# Patient Record
Sex: Male | Born: 1955 | Race: White | Hispanic: No | Marital: Married | Smoking: Former smoker
Health system: Southern US, Community
[De-identification: ages and names within clinical notes are randomized; demographics above are authoritative.]

## PROBLEM LIST (undated history)

## (undated) DIAGNOSIS — I2510 Atherosclerotic heart disease of native coronary artery without angina pectoris: Secondary | ICD-10-CM

## (undated) DIAGNOSIS — C801 Malignant (primary) neoplasm, unspecified: Secondary | ICD-10-CM

## (undated) DIAGNOSIS — J302 Other seasonal allergic rhinitis: Secondary | ICD-10-CM

## (undated) DIAGNOSIS — J339 Nasal polyp, unspecified: Secondary | ICD-10-CM

## (undated) DIAGNOSIS — G4733 Obstructive sleep apnea (adult) (pediatric): Secondary | ICD-10-CM

## (undated) DIAGNOSIS — J45909 Unspecified asthma, uncomplicated: Secondary | ICD-10-CM

## (undated) DIAGNOSIS — M109 Gout, unspecified: Secondary | ICD-10-CM

## (undated) DIAGNOSIS — K5090 Crohn's disease, unspecified, without complications: Secondary | ICD-10-CM

## (undated) DIAGNOSIS — E785 Hyperlipidemia, unspecified: Secondary | ICD-10-CM

## (undated) HISTORY — DX: Nasal polyp, unspecified: J33.9

## (undated) HISTORY — DX: Obstructive sleep apnea (adult) (pediatric): G47.33

## (undated) HISTORY — DX: Atherosclerotic heart disease of native coronary artery without angina pectoris: I25.10

## (undated) HISTORY — DX: Other seasonal allergic rhinitis: J30.2

## (undated) HISTORY — DX: Gout, unspecified: M10.9

## (undated) HISTORY — DX: Crohn's disease, unspecified, without complications: K50.90

## (undated) HISTORY — DX: Hyperlipidemia, unspecified: E78.5

---

## 1977-07-18 HISTORY — PX: COLON SURGERY: SHX602

## 1997-11-10 ENCOUNTER — Encounter: Admission: RE | Admit: 1997-11-10 | Discharge: 1997-11-10 | Payer: Self-pay | Admitting: Family Medicine

## 1997-12-09 ENCOUNTER — Encounter: Admission: RE | Admit: 1997-12-09 | Discharge: 1997-12-09 | Payer: Self-pay | Admitting: Sports Medicine

## 1997-12-18 ENCOUNTER — Encounter: Admission: RE | Admit: 1997-12-18 | Discharge: 1997-12-18 | Payer: Self-pay | Admitting: Family Medicine

## 1997-12-23 ENCOUNTER — Encounter: Admission: RE | Admit: 1997-12-23 | Discharge: 1997-12-23 | Payer: Self-pay | Admitting: Family Medicine

## 1999-08-17 ENCOUNTER — Ambulatory Visit (HOSPITAL_COMMUNITY): Admission: RE | Admit: 1999-08-17 | Discharge: 1999-08-17 | Payer: Self-pay | Admitting: *Deleted

## 2004-05-06 ENCOUNTER — Ambulatory Visit (HOSPITAL_COMMUNITY): Admission: RE | Admit: 2004-05-06 | Discharge: 2004-05-06 | Payer: Self-pay | Admitting: Surgery

## 2004-05-06 ENCOUNTER — Ambulatory Visit (HOSPITAL_BASED_OUTPATIENT_CLINIC_OR_DEPARTMENT_OTHER): Admission: RE | Admit: 2004-05-06 | Discharge: 2004-05-06 | Payer: Self-pay | Admitting: Surgery

## 2006-02-08 ENCOUNTER — Ambulatory Visit (HOSPITAL_COMMUNITY): Admission: RE | Admit: 2006-02-08 | Discharge: 2006-02-08 | Payer: Self-pay | Admitting: *Deleted

## 2011-05-27 ENCOUNTER — Institutional Professional Consult (permissible substitution): Payer: Self-pay | Admitting: Internal Medicine

## 2011-06-01 ENCOUNTER — Encounter: Payer: Self-pay | Admitting: Pulmonary Disease

## 2011-06-02 ENCOUNTER — Ambulatory Visit (INDEPENDENT_AMBULATORY_CARE_PROVIDER_SITE_OTHER): Payer: 59 | Admitting: Internal Medicine

## 2011-06-02 ENCOUNTER — Encounter: Payer: Self-pay | Admitting: Internal Medicine

## 2011-06-02 VITALS — BP 132/86 | HR 90 | Temp 97.7°F | Ht 67.5 in | Wt 168.0 lb

## 2011-06-02 DIAGNOSIS — R059 Cough, unspecified: Secondary | ICD-10-CM

## 2011-06-02 DIAGNOSIS — R05 Cough: Secondary | ICD-10-CM

## 2011-06-02 DIAGNOSIS — J45991 Cough variant asthma: Secondary | ICD-10-CM | POA: Insufficient documentation

## 2011-06-02 MED ORDER — MOMETASONE FURO-FORMOTEROL FUM 100-5 MCG/ACT IN AERO: 2.0000 | INHALATION_SPRAY | Freq: Two times a day (BID) | RESPIRATORY_TRACT | Status: DC

## 2011-06-02 NOTE — Patient Instructions (Addendum)
Work on Doctor, hospital technique:  relax and gently blow all the way out then take a nice smooth deep breath back in, triggering the inhaler at same time you start breathing in.  Hold for up to 5 seconds if you can then let it back out through your nose.  Rinse and gargle with water when done   If your mouth or throat starts to bother you,   I suggest you time the inhaler to your dental care and after using the inhaler(s) brush teeth and tongue with a baking soda containing toothpaste and when you rinse this out, gargle with it first to see if this helps your mouth and throat.    Stop advair and start dulera 100 Take 2 puffs first thing in am and then another 2 puffs about 12 hours later.    Be sure to take prilosec 20 mg x 30 min before bfast but also another dose before supper while still coughing.  GERD (REFLUX)  is an extremely common cause of respiratory symptoms, many times with no significant heartburn at all.    It can be treated with medication, but also with lifestyle changes including avoidance of late meals, excessive alcohol, smoking cessation, and avoid fatty foods, chocolate, peppermint, colas, red wine, and acidic juices such as orange juice.  NO MINT OR MENTHOL PRODUCTS SO NO COUGH DROPS  USE SUGARLESS CANDY INSTEAD (jolley ranchers or Stover's)  NO OIL BASED VITAMINS - use powdered substitutes.    As you improve you should be able to reduce your total number of medications dramatically, if not return to see me.

## 2011-06-02 NOTE — Progress Notes (Signed)
  Subjective:    Patient ID: ELIYOHU CLASS, male    DOB: 28-Apr-1956, 55 y.o.   MRN: [id number]  HPI  52 yowm quit smoking 1998 with seasonal allergies lifetime spring > fall seemed to handle it ok with otcs until the  fall of 2011 after uri while in [place]   developed more of a patern of  persistent sinus congestion   rx by Dr Wilson Singer and Janace Hoard best rx = prednisone then daily  cough started August 2012 and persistent since then eval by allergy 11/12 with concern for asthma rx with dulera since then but referred bo pulmonary 11/15 with issue of source of cough.   06/02/2011 1st pulmonary ov w/in 3 days of allergy eval cc cough no better yet, in fact worse p supper and at hs with slt green mucus.  Much better with albuterol, worse with advair.  Less nasal congestion at present. Sob mostly just during severe coughing fits.  Eventually get to  Sleeping ok without nocturnal  or early am exacerbation  of respiratory  c/o's or need for noct saba. Also denies any obvious fluctuation of symptoms with weather or environmental changes or other aggravating or alleviating factors except as outlined above    Review of Systems  Constitutional: Negative for fever, chills, activity change, appetite change and unexpected weight change.  HENT: Positive for congestion, rhinorrhea and sneezing. Negative for sore throat, trouble swallowing, dental problem, voice change and postnasal drip.   Eyes: Negative for visual disturbance.  Respiratory: Positive for cough and shortness of breath. Negative for choking.   Cardiovascular: Negative for chest pain and leg swelling.  Gastrointestinal: Negative for nausea, vomiting and abdominal pain.  Genitourinary: Negative for difficulty urinating.  Musculoskeletal: Negative for arthralgias.  Skin: Negative for rash.  Psychiatric/Behavioral: Negative for behavioral problems and confusion.       Objective:   Physical Exam  amb very anxious wm   Wt 168 06/02/2011    HEENT mild turbinate edema.  Oropharynx no thrush or excess pnd or cobblestoning.  No JVD or cervical adenopathy. Mild accessory muscle hypertrophy. Trachea midline, nl thryroid. Chest was hyperinflated by percussion with diminished breath sounds and moderate increased exp time without wheeze. Hoover sign positive at mid inspiration. Regular rate and rhythm without murmur gallop or rub or increase P2 or edema.  Abd: no hsm, nl excursion. Ext warm without cyanosis or clubbing.       cxr 04/26/11  wnl         Assessment & Plan:

## 2011-06-02 NOTE — Assessment & Plan Note (Signed)
The most common causes of chronic cough in immunocompetent adults include the following: upper airway cough syndrome (UACS), previously referred to as postnasal drip syndrome (PNDS), which is caused by variety of rhinosinus conditions; (2) asthma; (3) GERD; (4) chronic bronchitis from cigarette smoking or other inhaled environmental irritants; (5) nonasthmatic eosinophilic bronchitis; and (6) bronchiectasis.   These conditions, singly or in combination, have accounted for up to 94% of the causes of chronic cough in prospective studies.   Other conditions have constituted no >6% of the causes in prospective studies These have included bronchogenic carcinoma, chronic interstitial pneumonia, sarcoidosis, left ventricular failure, ACEI-induced cough, and aspiration from a condition associated with pharyngeal dysfunction.   Most likely this is a form of  Classic Upper airway cough syndrome, so named because it's frequently impossible to sort out how much is  CR/sinusitis with freq throat clearing (which can be related to primary GERD)   vs  causing  secondary (" extra esophageal")  GERD from wide swings in gastric pressure that occur with throat clearing, often  promoting self use of mint and menthol lozenges that reduce the lower esophageal sphincter tone and exacerbate the problem further in a cyclical fashion.   These are the same pts who not infrequently have failed to tolerate ace inhibitors,  dry powder inhalers or biphosphonates or report having reflux symptoms that don't respond to standard doses of PPI , and are easily confused as having aecopd or asthma flares,   For now treat the cycle of cough induces gerd induces cough with max gerd / cough suppression and changed advair to dulera 100 as this causes less cough in my experience  See instructions for specific recommendations which were reviewed directly with the patient who was given a copy with highlighter outlining the key components.

## 2012-06-06 ENCOUNTER — Other Ambulatory Visit: Payer: Self-pay | Admitting: Otolaryngology

## 2014-03-12 ENCOUNTER — Other Ambulatory Visit: Payer: Self-pay | Admitting: Urology

## 2014-03-12 DIAGNOSIS — C61 Malignant neoplasm of prostate: Secondary | ICD-10-CM

## 2014-03-17 ENCOUNTER — Other Ambulatory Visit (HOSPITAL_COMMUNITY): Payer: Self-pay | Admitting: Urology

## 2014-03-17 ENCOUNTER — Ambulatory Visit (HOSPITAL_COMMUNITY)
Admission: RE | Admit: 2014-03-17 | Discharge: 2014-03-17 | Disposition: A | Discharge status: Home / Self Care | Payer: 59 | Source: Ambulatory Visit | Attending: Urology | Admitting: Urology

## 2014-03-17 DIAGNOSIS — C61 Malignant neoplasm of prostate: Secondary | ICD-10-CM | POA: Insufficient documentation

## 2014-04-07 ENCOUNTER — Ambulatory Visit (HOSPITAL_COMMUNITY)
Admission: RE | Admit: 2014-04-07 | Discharge: 2014-04-07 | Disposition: A | Discharge status: Home / Self Care | Payer: 59 | Source: Ambulatory Visit | Attending: Urology | Admitting: Urology

## 2014-04-07 DIAGNOSIS — C61 Malignant neoplasm of prostate: Secondary | ICD-10-CM | POA: Diagnosis not present

## 2014-04-07 LAB — POCT I-STAT CREATININE: Creatinine, Ser: 0.9 mg/dL (ref 0.50–1.35)

## 2014-04-07 MED ORDER — GADOBENATE DIMEGLUMINE 529 MG/ML IV SOLN
20.0000 mL | Freq: Once | INTRAVENOUS | Status: AC | PRN
  Administered 2014-04-07: 16 mL via INTRAVENOUS

## 2016-04-11 DIAGNOSIS — R972 Elevated prostate specific antigen [PSA]: Secondary | ICD-10-CM | POA: Diagnosis not present

## 2016-04-11 DIAGNOSIS — C61 Malignant neoplasm of prostate: Secondary | ICD-10-CM | POA: Diagnosis not present

## 2016-04-15 DIAGNOSIS — J3081 Allergic rhinitis due to animal (cat) (dog) hair and dander: Secondary | ICD-10-CM | POA: Diagnosis not present

## 2016-04-15 DIAGNOSIS — J4540 Moderate persistent asthma, uncomplicated: Secondary | ICD-10-CM | POA: Diagnosis not present

## 2016-04-15 DIAGNOSIS — J3089 Other allergic rhinitis: Secondary | ICD-10-CM | POA: Diagnosis not present

## 2016-04-15 DIAGNOSIS — J301 Allergic rhinitis due to pollen: Secondary | ICD-10-CM | POA: Diagnosis not present

## 2016-06-08 DIAGNOSIS — D225 Melanocytic nevi of trunk: Secondary | ICD-10-CM | POA: Diagnosis not present

## 2016-06-08 DIAGNOSIS — C44319 Basal cell carcinoma of skin of other parts of face: Secondary | ICD-10-CM | POA: Diagnosis not present

## 2016-06-08 DIAGNOSIS — D2261 Melanocytic nevi of right upper limb, including shoulder: Secondary | ICD-10-CM | POA: Diagnosis not present

## 2016-06-08 DIAGNOSIS — D2262 Melanocytic nevi of left upper limb, including shoulder: Secondary | ICD-10-CM | POA: Diagnosis not present

## 2016-06-08 DIAGNOSIS — L821 Other seborrheic keratosis: Secondary | ICD-10-CM | POA: Diagnosis not present

## 2016-06-16 DIAGNOSIS — Z23 Encounter for immunization: Secondary | ICD-10-CM | POA: Diagnosis not present

## 2016-09-20 DIAGNOSIS — D1801 Hemangioma of skin and subcutaneous tissue: Secondary | ICD-10-CM | POA: Diagnosis not present

## 2016-09-21 DIAGNOSIS — C61 Malignant neoplasm of prostate: Secondary | ICD-10-CM | POA: Diagnosis not present

## 2016-09-26 DIAGNOSIS — C61 Malignant neoplasm of prostate: Secondary | ICD-10-CM | POA: Diagnosis not present

## 2016-10-12 DIAGNOSIS — E789 Disorder of lipoprotein metabolism, unspecified: Secondary | ICD-10-CM | POA: Diagnosis not present

## 2016-10-12 DIAGNOSIS — Z125 Encounter for screening for malignant neoplasm of prostate: Secondary | ICD-10-CM | POA: Diagnosis not present

## 2016-10-20 DIAGNOSIS — Z0001 Encounter for general adult medical examination with abnormal findings: Secondary | ICD-10-CM | POA: Diagnosis not present

## 2016-10-21 DIAGNOSIS — H25013 Cortical age-related cataract, bilateral: Secondary | ICD-10-CM | POA: Diagnosis not present

## 2016-10-21 DIAGNOSIS — H35361 Drusen (degenerative) of macula, right eye: Secondary | ICD-10-CM | POA: Diagnosis not present

## 2016-10-21 DIAGNOSIS — H2513 Age-related nuclear cataract, bilateral: Secondary | ICD-10-CM | POA: Diagnosis not present

## 2016-10-21 DIAGNOSIS — H524 Presbyopia: Secondary | ICD-10-CM | POA: Diagnosis not present

## 2016-10-21 DIAGNOSIS — H43393 Other vitreous opacities, bilateral: Secondary | ICD-10-CM | POA: Diagnosis not present

## 2016-11-11 DIAGNOSIS — J310 Chronic rhinitis: Secondary | ICD-10-CM | POA: Diagnosis not present

## 2016-11-11 DIAGNOSIS — Z6826 Body mass index (BMI) 26.0-26.9, adult: Secondary | ICD-10-CM | POA: Diagnosis not present

## 2016-11-11 DIAGNOSIS — J329 Chronic sinusitis, unspecified: Secondary | ICD-10-CM | POA: Diagnosis not present

## 2016-11-23 DIAGNOSIS — J320 Chronic maxillary sinusitis: Secondary | ICD-10-CM | POA: Diagnosis not present

## 2016-11-23 DIAGNOSIS — J329 Chronic sinusitis, unspecified: Secondary | ICD-10-CM | POA: Diagnosis not present

## 2016-11-23 DIAGNOSIS — R937 Abnormal findings on diagnostic imaging of other parts of musculoskeletal system: Secondary | ICD-10-CM | POA: Diagnosis not present

## 2016-11-23 DIAGNOSIS — J324 Chronic pansinusitis: Secondary | ICD-10-CM | POA: Diagnosis not present

## 2016-11-23 DIAGNOSIS — R918 Other nonspecific abnormal finding of lung field: Secondary | ICD-10-CM | POA: Diagnosis not present

## 2016-11-23 DIAGNOSIS — Z01818 Encounter for other preprocedural examination: Secondary | ICD-10-CM | POA: Diagnosis not present

## 2016-11-23 DIAGNOSIS — J342 Deviated nasal septum: Secondary | ICD-10-CM | POA: Diagnosis not present

## 2016-11-23 DIAGNOSIS — Z6826 Body mass index (BMI) 26.0-26.9, adult: Secondary | ICD-10-CM | POA: Diagnosis not present

## 2016-11-24 DIAGNOSIS — J324 Chronic pansinusitis: Secondary | ICD-10-CM | POA: Diagnosis not present

## 2016-11-28 DIAGNOSIS — Z87891 Personal history of nicotine dependence: Secondary | ICD-10-CM | POA: Diagnosis not present

## 2016-11-28 DIAGNOSIS — G4733 Obstructive sleep apnea (adult) (pediatric): Secondary | ICD-10-CM | POA: Diagnosis not present

## 2016-11-28 DIAGNOSIS — J309 Allergic rhinitis, unspecified: Secondary | ICD-10-CM | POA: Diagnosis not present

## 2016-11-28 DIAGNOSIS — K219 Gastro-esophageal reflux disease without esophagitis: Secondary | ICD-10-CM | POA: Diagnosis not present

## 2016-11-28 DIAGNOSIS — K5090 Crohn's disease, unspecified, without complications: Secondary | ICD-10-CM | POA: Diagnosis not present

## 2016-11-28 DIAGNOSIS — J45909 Unspecified asthma, uncomplicated: Secondary | ICD-10-CM | POA: Diagnosis not present

## 2016-11-28 DIAGNOSIS — Z79899 Other long term (current) drug therapy: Secondary | ICD-10-CM | POA: Diagnosis not present

## 2016-11-28 DIAGNOSIS — J310 Chronic rhinitis: Secondary | ICD-10-CM | POA: Diagnosis not present

## 2016-11-28 DIAGNOSIS — J329 Chronic sinusitis, unspecified: Secondary | ICD-10-CM | POA: Diagnosis not present

## 2016-11-28 DIAGNOSIS — J339 Nasal polyp, unspecified: Secondary | ICD-10-CM | POA: Diagnosis not present

## 2016-12-07 DIAGNOSIS — J324 Chronic pansinusitis: Secondary | ICD-10-CM | POA: Diagnosis not present

## 2016-12-28 DIAGNOSIS — J324 Chronic pansinusitis: Secondary | ICD-10-CM | POA: Diagnosis not present

## 2017-02-03 ENCOUNTER — Ambulatory Visit (INDEPENDENT_AMBULATORY_CARE_PROVIDER_SITE_OTHER)
Admission: RE | Admit: 2017-02-03 | Discharge: 2017-02-03 | Disposition: A | Discharge status: Home / Self Care | Payer: BLUE CROSS/BLUE SHIELD | Source: Ambulatory Visit | Attending: Internal Medicine | Admitting: Internal Medicine

## 2017-02-03 ENCOUNTER — Ambulatory Visit (INDEPENDENT_AMBULATORY_CARE_PROVIDER_SITE_OTHER): Payer: BLUE CROSS/BLUE SHIELD | Admitting: Internal Medicine

## 2017-02-03 ENCOUNTER — Encounter: Payer: Self-pay | Admitting: Internal Medicine

## 2017-02-03 VITALS — BP 140/84 | HR 90 | Ht 67.0 in | Wt 176.8 lb

## 2017-02-03 DIAGNOSIS — R911 Solitary pulmonary nodule: Secondary | ICD-10-CM | POA: Diagnosis not present

## 2017-02-03 DIAGNOSIS — R0600 Dyspnea, unspecified: Secondary | ICD-10-CM

## 2017-02-03 DIAGNOSIS — J45991 Cough variant asthma: Secondary | ICD-10-CM | POA: Diagnosis not present

## 2017-02-03 DIAGNOSIS — R059 Cough, unspecified: Secondary | ICD-10-CM

## 2017-02-03 DIAGNOSIS — R0609 Other forms of dyspnea: Secondary | ICD-10-CM

## 2017-02-03 DIAGNOSIS — R05 Cough: Secondary | ICD-10-CM | POA: Diagnosis not present

## 2017-02-03 MED ORDER — MOMETASONE FURO-FORMOTEROL FUM 100-5 MCG/ACT IN AERO: 2.0000 | INHALATION_SPRAY | Freq: Every day | RESPIRATORY_TRACT | 0 refills | Status: DC | PRN

## 2017-02-03 NOTE — Progress Notes (Signed)
Subjective:    Patient ID: Dale Thompson, male    DOB: 1955-11-22,     MRN: [id number]    Brief patient profile:  52 yowm quit smoking 1998 with seasonal allergies lifetime spring > fall seemed to handle it ok with otcs until the  fall of 2011 after uri while in [place]   developed more of a patern of  persistent sinus congestion   rx by Dr Wilson Singer and Janace Hoard best rx = prednisone then daily  cough started August 2012 and persistent since then eval by allergy vanWinkle 11/12 with concern for asthma rx with dulera since then but referred bo pulmonary 11/15 with issue of source of cough and referred back 02/03/2017 for ?  SPN LLL on cxr     History of Present Illness  06/02/2011 1st pulmonary ov w/in 3 days of allergy eval cc cough no better yet, in fact worse p supper and at hs with slt green mucus.  Much better with albuterol, worse with advair.  Less nasal congestion at present. Sob mostly just during severe coughing fits. rec Work on Gaffer:  Stop advair and start dulera 100 Take 2 puffs first thing in am and then another 2 puffs about 12 hours later.  be sure to take prilosec 20 mg x 30 min before bfast but also another dose before supper while still coughing. GERD (REFLUX)  As you improve you should be able to reduce your total number of medications dramatically, if not return to see me.    02/03/2017  Consultation/ ov/Wert re: spn/ doe  Chief Complaint  Patient presents with  . Pulmonary Consult    Referred by Dr. Deland Pretty for eval of incidental pulmonary nodule.  Pt states that he has always had some SOB ever since his teens, but it has been progressively worse. He gets winded walking up an incline.   doing much better since sinus surgery per [facility] but preop cxr suggested LLL spn so pt referred back here  Only sob with exercise and gradually worse since teenager x when he was working out more regularly 6 m prior to [place] 100 but rarely uses it, not  really sure it helps much (poor hfa, see a/p)    No obvious day to day or daytime variability or assoc excess/ purulent sputum or mucus plugs or hemoptysis or cp or chest tightness, subjective wheeze or overt sinus or hb symptoms. No unusual exp hx or h/o childhood pna/ asthma or knowledge of premature birth.  Sleeping ok without nocturnal  or early am exacerbation  of respiratory  c/o's or need for noct saba. Also denies any obvious fluctuation of symptoms with weather or environmental changes or other aggravating or alleviating factors except as outlined above   Current Medications, Allergies, Complete Past Medical History, Past Surgical History, Family History, and Social History were reviewed in [employer] record.  ROS  The following are not active complaints unless bolded sore throat, dysphagia, dental problems, itching, sneezing,  nasal congestion controlled with flonase or excess/ purulent secretions, ear ache,   fever, chills, sweats, unintended wt loss, classically pleuritic or exertional cp,  orthopnea pnd or leg swelling, presyncope, palpitations, abdominal pain, anorexia, nausea, vomiting, diarrhea  or change in bowel or bladder habits, change in stools or urine, dysuria,hematuria,  rash, arthralgias, visual complaints, headache, numbness, weakness or ataxia or problems with walking or coordination,  change in mood/affect or memory.  Objective:   Physical Exam  amb very anxious slt hoarse wm nad   Wt Readings from Last 3 Encounters:  02/03/17 176 lb 12.8 oz (80.2 kg)  06/02/11 168 lb (76.2 kg)   Vital signs reviewed - Note on arrival 02 sats  93% on RA     HEENT: nl dentition, turbinates bilaterally, and oropharynx. Nl external ear canals without cough reflex   NECK :  without JVD/Nodes/TM/ nl carotid upstrokes bilaterally   LUNGS: no acc muscle use,  Nl contour chest which is clear to A and P bilaterally without cough on insp or  exp maneuvers   CV:  RRR  no s3 or murmur or increase in P2, and no edema   ABD:  soft and nontender with nl inspiratory excursion in the supine position. No bruits or organomegaly appreciated, bowel sounds nl  MS:  Nl gait/ ext warm without deformities, calf tenderness, cyanosis or clubbing No obvious joint restrictions   SKIN: warm and dry without lesions    NEURO:  alert, approp, nl sensorium with  no motor or cerebellar deficits apparent.      I  reviewed report and agree with radiology impression as follows:  CXR:   11/23/16 No acute airspace disease. 5 mm nodular opacity over the left lung base may represent a pulmonary nodule. Chest CT recommended for further evaluation.   CXR PA and Lateral:   02/03/2017 :    I personally reviewed images and agree with radiology impression as follows:   The heart size and mediastinal contours are within normal limits. Both lungs are clear. The visualized skeletal structures are unremarkable. There mild linear scarring at the LEFT base, similar to priors.  With regard to the stated history, I do not have prior chest radiograph or cross-sectional imaging which documents a LEFT lower lobe nodule. None is seen on today's exam. My impression:  Hard to exclude a nodule in L base but mostly just linear scarring             Assessment & Plan:

## 2017-02-03 NOTE — Patient Instructions (Addendum)
If any trouble with breathing or wheezing or cough > dulera 100 up to 2 puffs every 12 hours    Please remember to go to the  x-ray department downstairs in the basement  for your tests - we will call you with the results when they are available. Add  HRCT in 3 m

## 2017-02-04 ENCOUNTER — Encounter: Payer: Self-pay | Admitting: Internal Medicine

## 2017-02-04 DIAGNOSIS — R0600 Dyspnea, unspecified: Secondary | ICD-10-CM | POA: Insufficient documentation

## 2017-02-04 DIAGNOSIS — R0609 Other forms of dyspnea: Secondary | ICD-10-CM

## 2017-02-04 NOTE — Assessment & Plan Note (Addendum)
Note this was better with regular ex so probably just related to deconditioning/ reviewed   Since there is a def change over baseline with "abn cxr" > hrct will be done in 3 m to complete the w/u  Total time devoted to counseling  > 50 % of initial 60 min office visit:  review case with pt/ discussion of options/alternatives/ personally creating written customized instructions  in presence of pt  then going over those specific  Instructions directly with the pt including how to use all of the meds but in particular covering each new medication in detail and the difference between the maintenance= "automatic" meds and the prns using an action plan format for the latter (If this problem/symptom => do that organization reading Left to right).  Please see AVS from this visit for a full list of these instructions which I personally wrote for this pt and  are unique to this visit.

## 2017-02-04 NOTE — Assessment & Plan Note (Addendum)
Spirometry 02/03/2017 wnl including mid flows/ f/v curve off rx s active symptoms other than doe      - hfa 75% p coaching 02/03/2017 > increase dulera  100 to 2bid when cough or sob  He appears to have very mild persistent asthma. Based on the study from NEJM  378; 18 p 1865 (2018) in pts with mild asthma it is reasonable to use low dose dulera  1000 2bid "prn" flare in this setting but I emphasized this was only shown with symbicort (which is very similar to dulera) and takes advantage of the rapid onset of action but is not the same as "rescue therapy" but can be stopped once the acute symptoms have resolved and the need for rescue has been minimized (< 2 x weekly)    Total time devoted to counseling  > 50 % of initial 60 min office visit:  review case with pt/ discussion of options/alternatives/ personally creating written customized instructions  in presence of pt  then going over those specific  Instructions directly with the pt including how to use all of the meds but in particular covering each new medication in detail and the difference between the maintenance= "automatic" meds and the prns using an action plan format for the latter (If this problem/symptom => do that organization reading Left to right).  Please see AVS from this visit for a full list of these instructions which I personally wrote for this pt and  are unique to this visit.

## 2017-02-04 NOTE — Assessment & Plan Note (Signed)
Reported on cxr 11/23/16 but no def seen 02/03/2017   He does have some linear scarring/ increased markings in L base with unexplained doe so rec HRCT in 3 months to close the loop on both problems as he is a former smoker with low but not "no" risk   Discussed in detail all the  indications, usual  risks and alternatives  relative to the benefits with patient who agrees to proceed with conservative f/u as outlined

## 2017-02-06 ENCOUNTER — Other Ambulatory Visit: Payer: Self-pay | Admitting: Internal Medicine

## 2017-02-06 DIAGNOSIS — R911 Solitary pulmonary nodule: Secondary | ICD-10-CM

## 2017-02-06 NOTE — Progress Notes (Signed)
Spoke with pt and notified of results per Dr. Wert. Pt verbalized understanding and denied any questions. 

## 2017-03-01 DIAGNOSIS — J324 Chronic pansinusitis: Secondary | ICD-10-CM | POA: Diagnosis not present

## 2017-03-27 DIAGNOSIS — C61 Malignant neoplasm of prostate: Secondary | ICD-10-CM | POA: Diagnosis not present

## 2017-03-30 ENCOUNTER — Telehealth: Payer: Self-pay | Admitting: Internal Medicine

## 2017-03-30 NOTE — Telephone Encounter (Signed)
Ok PCC"s can we do this for pt. We may have to fax order to the number below. Thanks.

## 2017-03-30 NOTE — Telephone Encounter (Signed)
Order faxed to [facility] on [address]

## 2017-04-03 DIAGNOSIS — C61 Malignant neoplasm of prostate: Secondary | ICD-10-CM | POA: Diagnosis not present

## 2017-04-03 DIAGNOSIS — E291 Testicular hypofunction: Secondary | ICD-10-CM | POA: Diagnosis not present

## 2017-04-17 DIAGNOSIS — J301 Allergic rhinitis due to pollen: Secondary | ICD-10-CM | POA: Diagnosis not present

## 2017-04-17 DIAGNOSIS — J4540 Moderate persistent asthma, uncomplicated: Secondary | ICD-10-CM | POA: Diagnosis not present

## 2017-04-17 DIAGNOSIS — J3081 Allergic rhinitis due to animal (cat) (dog) hair and dander: Secondary | ICD-10-CM | POA: Diagnosis not present

## 2017-04-17 DIAGNOSIS — J3089 Other allergic rhinitis: Secondary | ICD-10-CM | POA: Diagnosis not present

## 2017-04-18 ENCOUNTER — Other Ambulatory Visit: Payer: BLUE CROSS/BLUE SHIELD

## 2017-04-18 DIAGNOSIS — R918 Other nonspecific abnormal finding of lung field: Secondary | ICD-10-CM | POA: Diagnosis not present

## 2017-04-21 ENCOUNTER — Telehealth: Payer: Self-pay | Admitting: Internal Medicine

## 2017-04-21 NOTE — Telephone Encounter (Signed)
CT Chest done at [facility] on 04/18/17 was reviewed by MW Per MW- study shows no change, needs f/u CT Chest without CM in 3 months and then ov next day  [facility]

## 2017-04-24 DIAGNOSIS — E291 Testicular hypofunction: Secondary | ICD-10-CM | POA: Diagnosis not present

## 2017-04-24 DIAGNOSIS — C61 Malignant neoplasm of prostate: Secondary | ICD-10-CM | POA: Diagnosis not present

## 2017-04-24 NOTE — Telephone Encounter (Signed)
Advised pt of results. Pt understood and nothing further is needed.   CT ordered. Please call to scheule/

## 2017-05-02 DIAGNOSIS — Z23 Encounter for immunization: Secondary | ICD-10-CM | POA: Diagnosis not present

## 2017-06-05 ENCOUNTER — Telehealth: Payer: Self-pay | Admitting: Internal Medicine

## 2017-06-05 DIAGNOSIS — R911 Solitary pulmonary nodule: Secondary | ICD-10-CM

## 2017-06-05 NOTE — Telephone Encounter (Signed)
Fine with me

## 2017-06-05 NOTE — Telephone Encounter (Signed)
Dr. Melvyn Novas, ok to schedule CT at the end of December instead of January? He would like to do this for better coverage with insurance. Please advise.

## 2017-06-05 NOTE — Telephone Encounter (Signed)
Spoke with pt,aware of recs.  Nothing further needed.  

## 2017-06-14 DIAGNOSIS — Z6828 Body mass index (BMI) 28.0-28.9, adult: Secondary | ICD-10-CM | POA: Diagnosis not present

## 2017-06-14 DIAGNOSIS — J329 Chronic sinusitis, unspecified: Secondary | ICD-10-CM | POA: Diagnosis not present

## 2017-06-14 DIAGNOSIS — J310 Chronic rhinitis: Secondary | ICD-10-CM | POA: Diagnosis not present

## 2017-06-22 ENCOUNTER — Other Ambulatory Visit: Payer: Self-pay | Admitting: Internal Medicine

## 2017-06-22 DIAGNOSIS — R911 Solitary pulmonary nodule: Secondary | ICD-10-CM

## 2017-07-14 DIAGNOSIS — C61 Malignant neoplasm of prostate: Secondary | ICD-10-CM | POA: Diagnosis not present

## 2017-07-14 DIAGNOSIS — R918 Other nonspecific abnormal finding of lung field: Secondary | ICD-10-CM | POA: Diagnosis not present

## 2017-07-14 DIAGNOSIS — J984 Other disorders of lung: Secondary | ICD-10-CM | POA: Diagnosis not present

## 2017-07-14 DIAGNOSIS — E291 Testicular hypofunction: Secondary | ICD-10-CM | POA: Diagnosis not present

## 2017-07-20 ENCOUNTER — Telehealth: Payer: Self-pay | Admitting: Internal Medicine

## 2017-07-20 NOTE — Telephone Encounter (Signed)
Per MW- CT chest from [facility] dated 07/14/17 showed no changes, but he does want to see him in the office in the next 2 wks. Spoke with pt and notified of results per Dr. Melvyn Novas. Pt verbalized understanding and denied any questions.

## 2017-07-24 ENCOUNTER — Encounter: Payer: Self-pay | Admitting: Internal Medicine

## 2017-07-24 ENCOUNTER — Other Ambulatory Visit: Payer: BLUE CROSS/BLUE SHIELD

## 2017-07-24 ENCOUNTER — Ambulatory Visit (INDEPENDENT_AMBULATORY_CARE_PROVIDER_SITE_OTHER): Payer: BLUE CROSS/BLUE SHIELD | Admitting: Internal Medicine

## 2017-07-24 VITALS — BP 138/80 | HR 86 | Ht 67.0 in | Wt 180.0 lb

## 2017-07-24 DIAGNOSIS — R911 Solitary pulmonary nodule: Secondary | ICD-10-CM

## 2017-07-24 DIAGNOSIS — J45991 Cough variant asthma: Secondary | ICD-10-CM

## 2017-07-24 NOTE — Assessment & Plan Note (Signed)
Reported on cxr 11/23/16 but not def seen 02/03/2017  - HRCT 04/18/17 :  Bilateral gg nodules 1.6 cm rul largest   - CT w/o contrast 07/14/17  No change LUL / no mention of RUL by radiology report from [facility]  - placed in reminder file for CT chest 1st week in 2020    CT results reviewed with pt >>> Too small for PET or bx, not suspicious enough for excisional bx > really only option for now is follow the Fleischner society guidelines as rec by radiology = 1 year f/u CT unless new symptoms  Discussed in detail all the  indications, usual  risks and alternatives  relative to the benefits with patient who agrees to proceed with conservative f/u as outlined

## 2017-07-24 NOTE — Assessment & Plan Note (Signed)
Spirometry 02/03/2017 wnl including mid flows/ f/v curve off rx s active symptoms other than doe     - hfa 75% p coaching 02/03/2017 > increase dulera  100 to 2bid when cough or sob  All goals of chronic asthma control met including optimal function and elimination of symptoms with minimal need for rescue therapy.  Contingencies discussed in full including contacting this office immediately if not controlling the symptoms using the rule of two's.      I had an extended discussion with the patient reviewing all relevant studies completed to date and  lasting 15 to 20 minutes of a 25 minute visit    Each maintenance medication was reviewed in detail including most importantly the difference between maintenance and prns and under what circumstances the prns are to be triggered using an action plan format that is not reflected in the computer generated alphabetically organized AVS.    Please see AVS for specific instructions unique to this visit that I personally wrote and verbalized to the the pt in detail and then reviewed with pt  by my nurse highlighting any  changes in therapy recommended at today's visit to their plan of care.

## 2017-07-24 NOTE — Patient Instructions (Addendum)
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

## 2017-07-24 NOTE — Progress Notes (Signed)
Subjective:    Patient ID: Dale Thompson, male    DOB: 1955/09/04,     MRN: [id number]    Brief patient profile:  32 yowm quit smoking 1998 with seasonal allergies lifetime spring > fall seemed to handle it ok with otcs until the  fall of 2011 after uri while in [place]   developed more of a patern of  persistent sinus congestion   rx by Dr Wilson Singer and Janace Hoard best rx = prednisone then daily  cough started August 2012 and persistent since then eval by allergy vanWinkle 11/12 with concern for asthma rx with dulera since then but referred bo pulmonary 11/15 with issue of source of cough and referred back 02/03/2017 for ?  SPN LLL on cxr     History of Present Illness  06/02/2011 1st pulmonary ov w/in 3 days of allergy eval cc cough no better yet, in fact worse p supper and at hs with slt green mucus.  Much better with albuterol, worse with advair.  Less nasal congestion at present. Sob mostly just during severe coughing fits. rec Work on Gaffer:  Stop advair and start dulera 100 Take 2 puffs first thing in am and then another 2 puffs about 12 hours later.  be sure to take prilosec 20 mg x 30 min before bfast but also another dose before supper while still coughing. GERD (REFLUX)  As you improve you should be able to reduce your total number of medications dramatically, if not return to see me.    02/03/2017  Consultation/ ov/Dale Thompson re: spn/ doe  Chief Complaint  Patient presents with  . Pulmonary Consult    Referred by Dr. Deland Pretty for eval of incidental pulmonary nodule.  Pt states that he has always had some SOB ever since his teens, but it has been progressively worse. He gets winded walking up an incline.   doing much better since sinus surgery per [facility] but preop cxr suggested LLL spn so pt referred back here Only sob with exercise and gradually worse since teenager x when he was working out more regularly 6 m prior to [place] 100 but rarely uses it, not  really sure it helps much (poor hfa, see a/p)  rec If any trouble with breathing or wheezing or cough > dulera 100 up to 2 puffs every 12 hours  Please remember to go to the  x-ray department downstairs in the basement  for your tests - we will call you with the results when they are available. Add  HRCT in 3 m    07/24/2017  f/u ov/Dale Thompson re:  Chief Complaint  Patient presents with  . Follow-up    CT Chest done [facility] 07/14/17. He states doing well and no new co's today. He uses the [facility] 2 x per wk on average.  cough much better as is breathing just using the dulera 100 a few times a week  Sleeping fine/ Not limited by breathing from desired activities    No obvious day to day or daytime variability or assoc excess/ purulent sputum or mucus plugs or hemoptysis or cp or chest tightness, subjective wheeze or overt   hb symptoms. No unusual exposure hx or h/o childhood pna/ asthma or knowledge of premature birth.  Sleeping ok flat without nocturnal  or early am exacerbation  of respiratory  c/o's or need for noct saba. Also denies any obvious fluctuation of symptoms with weather or environmental changes or other aggravating or  alleviating factors except as outlined above   Current Allergies, Complete Past Medical History, Past Surgical History, Family History, and Social History were reviewed in [employer] record.  ROS  The following are not active complaints unless bolded Hoarseness, sore throat, dysphagia, dental problems, itching, sneezing,  nasal congestion or discharge of excess mucus or purulent secretions, ear ache,   fever, chills, sweats, unintended wt loss or wt gain, classically pleuritic or exertional cp,  orthopnea pnd or leg swelling, presyncope, palpitations, abdominal pain, anorexia, nausea, vomiting, diarrhea  or change in bowel habits or change in bladder habits, change in stools or change in urine, dysuria, hematuria,  rash, arthralgias, visual  complaints, headache, numbness, weakness or ataxia or problems with walking or coordination,  change in mood/affect or memory.        Current Meds  Medication Sig  . atorvastatin (LIPITOR) 10 MG tablet Take 10 mg by mouth daily.    . budesonide (PULMICORT) 0.5 MG/2ML nebulizer solution Rinse as directed twice daily  . Celery Seed OIL by Does not apply route daily.  . cetirizine (ZYRTEC) 10 MG tablet Take 10 mg by mouth daily.    . mometasone-formoterol (DULERA) 100-5 MCG/ACT AERO Inhale 2 puffs into the lungs daily as needed for wheezing.  Marland Kitchen omeprazole (PRILOSEC) 20 MG capsule Take 20 mg by mouth daily.    . Thiamine HCl (VITAMIN B-1 PO) Take 1 tablet by mouth daily.                            Objective:   Physical Exam  amb wm nad    07/24/2017        180   02/03/17 176 lb 12.8 oz (80.2 kg)  06/02/11 168 lb (76.2 kg)   Vital signs reviewed - Note on arrival 02 sats  98% on RA      HEENT: nl dentition, turbinates bilaterally, and oropharynx. Nl external ear canals without cough reflex   NECK :  without JVD/Nodes/TM/ nl carotid upstrokes bilaterally   LUNGS: no acc muscle use,  Nl contour chest which is clear to A and P bilaterally without cough on insp or exp maneuvers   CV:  RRR  no s3 or murmur or increase in P2, and no edema   ABD:  soft and nontender with nl inspiratory excursion in the supine position. No bruits or organomegaly appreciated, bowel sounds nl  MS:  Nl gait/ ext warm without deformities, calf tenderness, cyanosis or clubbing No obvious joint restrictions   SKIN: warm and dry without lesions    NEURO:  alert, approp, nl sensorium with  no motor or cerebellar deficits apparent.                Assessment & Plan:

## 2017-08-09 DIAGNOSIS — J324 Chronic pansinusitis: Secondary | ICD-10-CM | POA: Diagnosis not present

## 2017-08-09 DIAGNOSIS — Z6827 Body mass index (BMI) 27.0-27.9, adult: Secondary | ICD-10-CM | POA: Diagnosis not present

## 2017-10-11 DIAGNOSIS — C61 Malignant neoplasm of prostate: Secondary | ICD-10-CM | POA: Diagnosis not present

## 2017-10-11 DIAGNOSIS — E291 Testicular hypofunction: Secondary | ICD-10-CM | POA: Diagnosis not present

## 2017-10-23 DIAGNOSIS — C61 Malignant neoplasm of prostate: Secondary | ICD-10-CM | POA: Diagnosis not present

## 2017-10-23 DIAGNOSIS — E291 Testicular hypofunction: Secondary | ICD-10-CM | POA: Diagnosis not present

## 2017-10-25 DIAGNOSIS — Z0000 Encounter for general adult medical examination without abnormal findings: Secondary | ICD-10-CM | POA: Diagnosis not present

## 2017-10-25 DIAGNOSIS — M109 Gout, unspecified: Secondary | ICD-10-CM | POA: Diagnosis not present

## 2017-10-30 DIAGNOSIS — Z0001 Encounter for general adult medical examination with abnormal findings: Secondary | ICD-10-CM | POA: Diagnosis not present

## 2017-10-30 DIAGNOSIS — Z23 Encounter for immunization: Secondary | ICD-10-CM | POA: Diagnosis not present

## 2017-10-30 DIAGNOSIS — E789 Disorder of lipoprotein metabolism, unspecified: Secondary | ICD-10-CM | POA: Diagnosis not present

## 2017-11-13 DIAGNOSIS — M810 Age-related osteoporosis without current pathological fracture: Secondary | ICD-10-CM | POA: Diagnosis not present

## 2017-11-13 DIAGNOSIS — K5090 Crohn's disease, unspecified, without complications: Secondary | ICD-10-CM | POA: Diagnosis not present

## 2017-11-13 DIAGNOSIS — M1990 Unspecified osteoarthritis, unspecified site: Secondary | ICD-10-CM | POA: Diagnosis not present

## 2017-11-13 DIAGNOSIS — M109 Gout, unspecified: Secondary | ICD-10-CM | POA: Diagnosis not present

## 2017-11-13 DIAGNOSIS — M79672 Pain in left foot: Secondary | ICD-10-CM | POA: Diagnosis not present

## 2017-12-20 DIAGNOSIS — K5090 Crohn's disease, unspecified, without complications: Secondary | ICD-10-CM | POA: Diagnosis not present

## 2017-12-20 DIAGNOSIS — M79672 Pain in left foot: Secondary | ICD-10-CM | POA: Diagnosis not present

## 2017-12-20 DIAGNOSIS — M109 Gout, unspecified: Secondary | ICD-10-CM | POA: Diagnosis not present

## 2017-12-20 DIAGNOSIS — M1990 Unspecified osteoarthritis, unspecified site: Secondary | ICD-10-CM | POA: Diagnosis not present

## 2018-01-31 DIAGNOSIS — M109 Gout, unspecified: Secondary | ICD-10-CM | POA: Diagnosis not present

## 2018-01-31 DIAGNOSIS — M79672 Pain in left foot: Secondary | ICD-10-CM | POA: Diagnosis not present

## 2018-01-31 DIAGNOSIS — M1990 Unspecified osteoarthritis, unspecified site: Secondary | ICD-10-CM | POA: Diagnosis not present

## 2018-01-31 DIAGNOSIS — K5090 Crohn's disease, unspecified, without complications: Secondary | ICD-10-CM | POA: Diagnosis not present

## 2018-02-06 DIAGNOSIS — M810 Age-related osteoporosis without current pathological fracture: Secondary | ICD-10-CM | POA: Diagnosis not present

## 2018-02-06 DIAGNOSIS — E559 Vitamin D deficiency, unspecified: Secondary | ICD-10-CM | POA: Diagnosis not present

## 2018-02-28 DIAGNOSIS — Z6826 Body mass index (BMI) 26.0-26.9, adult: Secondary | ICD-10-CM | POA: Diagnosis not present

## 2018-02-28 DIAGNOSIS — J324 Chronic pansinusitis: Secondary | ICD-10-CM | POA: Diagnosis not present

## 2018-03-05 DIAGNOSIS — E559 Vitamin D deficiency, unspecified: Secondary | ICD-10-CM | POA: Diagnosis not present

## 2018-03-05 DIAGNOSIS — E875 Hyperkalemia: Secondary | ICD-10-CM | POA: Diagnosis not present

## 2018-03-06 DIAGNOSIS — E559 Vitamin D deficiency, unspecified: Secondary | ICD-10-CM | POA: Diagnosis not present

## 2018-03-06 DIAGNOSIS — M810 Age-related osteoporosis without current pathological fracture: Secondary | ICD-10-CM | POA: Diagnosis not present

## 2018-05-07 DIAGNOSIS — C61 Malignant neoplasm of prostate: Secondary | ICD-10-CM | POA: Diagnosis not present

## 2018-05-07 DIAGNOSIS — N5201 Erectile dysfunction due to arterial insufficiency: Secondary | ICD-10-CM | POA: Diagnosis not present

## 2018-05-07 DIAGNOSIS — E291 Testicular hypofunction: Secondary | ICD-10-CM | POA: Diagnosis not present

## 2018-06-20 ENCOUNTER — Other Ambulatory Visit: Payer: Self-pay | Admitting: Internal Medicine

## 2018-06-20 DIAGNOSIS — R911 Solitary pulmonary nodule: Secondary | ICD-10-CM

## 2018-08-07 DIAGNOSIS — M109 Gout, unspecified: Secondary | ICD-10-CM | POA: Diagnosis not present

## 2018-08-07 DIAGNOSIS — M1990 Unspecified osteoarthritis, unspecified site: Secondary | ICD-10-CM | POA: Diagnosis not present

## 2018-08-07 DIAGNOSIS — R202 Paresthesia of skin: Secondary | ICD-10-CM | POA: Diagnosis not present

## 2018-08-07 DIAGNOSIS — K5090 Crohn's disease, unspecified, without complications: Secondary | ICD-10-CM | POA: Diagnosis not present

## 2019-02-05 DIAGNOSIS — Z79899 Other long term (current) drug therapy: Secondary | ICD-10-CM | POA: Diagnosis not present

## 2019-02-05 DIAGNOSIS — M109 Gout, unspecified: Secondary | ICD-10-CM | POA: Diagnosis not present

## 2019-02-05 DIAGNOSIS — M1990 Unspecified osteoarthritis, unspecified site: Secondary | ICD-10-CM | POA: Diagnosis not present

## 2019-02-05 DIAGNOSIS — K5090 Crohn's disease, unspecified, without complications: Secondary | ICD-10-CM | POA: Diagnosis not present

## 2019-02-12 DIAGNOSIS — Z125 Encounter for screening for malignant neoplasm of prostate: Secondary | ICD-10-CM | POA: Diagnosis not present

## 2019-06-28 DIAGNOSIS — Z0000 Encounter for general adult medical examination without abnormal findings: Secondary | ICD-10-CM | POA: Diagnosis not present

## 2019-06-28 DIAGNOSIS — E789 Disorder of lipoprotein metabolism, unspecified: Secondary | ICD-10-CM | POA: Diagnosis not present

## 2019-06-28 DIAGNOSIS — Z125 Encounter for screening for malignant neoplasm of prostate: Secondary | ICD-10-CM | POA: Diagnosis not present

## 2019-07-05 ENCOUNTER — Other Ambulatory Visit: Payer: Self-pay | Admitting: Internal Medicine

## 2019-07-05 DIAGNOSIS — M1A9XX0 Chronic gout, unspecified, without tophus (tophi): Secondary | ICD-10-CM | POA: Diagnosis not present

## 2019-07-05 DIAGNOSIS — E789 Disorder of lipoprotein metabolism, unspecified: Secondary | ICD-10-CM | POA: Diagnosis not present

## 2019-07-05 DIAGNOSIS — Z0000 Encounter for general adult medical examination without abnormal findings: Secondary | ICD-10-CM | POA: Diagnosis not present

## 2019-07-05 DIAGNOSIS — R911 Solitary pulmonary nodule: Secondary | ICD-10-CM

## 2019-07-05 DIAGNOSIS — G4733 Obstructive sleep apnea (adult) (pediatric): Secondary | ICD-10-CM | POA: Diagnosis not present

## 2019-07-05 DIAGNOSIS — K219 Gastro-esophageal reflux disease without esophagitis: Secondary | ICD-10-CM | POA: Diagnosis not present

## 2019-07-16 ENCOUNTER — Other Ambulatory Visit: Payer: BLUE CROSS/BLUE SHIELD

## 2019-07-16 ENCOUNTER — Ambulatory Visit
Admission: RE | Admit: 2019-07-16 | Discharge: 2019-07-16 | Disposition: A | Discharge status: Home / Self Care | Payer: BC Managed Care – PPO | Source: Ambulatory Visit | Attending: Internal Medicine | Admitting: Internal Medicine

## 2019-07-16 DIAGNOSIS — R911 Solitary pulmonary nodule: Secondary | ICD-10-CM

## 2019-07-23 ENCOUNTER — Ambulatory Visit: Payer: BC Managed Care – PPO | Admitting: Internal Medicine

## 2019-08-25 ENCOUNTER — Ambulatory Visit: Payer: BC Managed Care – PPO | Attending: Internal Medicine

## 2019-08-25 DIAGNOSIS — Z23 Encounter for immunization: Secondary | ICD-10-CM | POA: Insufficient documentation

## 2019-08-25 NOTE — Progress Notes (Signed)
   Covid-19 Vaccination Clinic  Name:  CORI HENNINGSEN    MRN: [id number] DOB: [birth year]  08/25/2019  Mr. Zeitler was observed post Covid-19 immunization for 15 minutes without incidence. He was provided with Vaccine Information Sheet and instruction to access the V-Safe system.   Mr. Seybold was instructed to call 911 with any severe reactions post vaccine: Marland Kitchen Difficulty breathing  . Swelling of your face and throat  . A fast heartbeat  . A bad rash all over your body  . Dizziness and weakness    Immunizations Administered    Name Date Dose VIS Date Route   Pfizer COVID-19 Vaccine 08/25/2019  5:07 PM 0.3 mL 06/28/2019 Intramuscular   Manufacturer: [place]   Lot: [id number]   [place]: [id number]

## 2019-09-17 DIAGNOSIS — C61 Malignant neoplasm of prostate: Secondary | ICD-10-CM | POA: Diagnosis not present

## 2019-09-19 ENCOUNTER — Ambulatory Visit: Payer: BC Managed Care – PPO | Attending: Internal Medicine

## 2019-09-19 DIAGNOSIS — Z23 Encounter for immunization: Secondary | ICD-10-CM | POA: Insufficient documentation

## 2019-09-19 NOTE — Progress Notes (Signed)
   Covid-19 Vaccination Clinic  Name:  Dale Thompson    MRN: [id number] DOB: October 16, 1955  09/19/2019  Mr. Showers was observed post Covid-19 immunization for 15 minutes without incident. He was provided with Vaccine Information Sheet and instruction to access the V-Safe system.   [name] was instructed to call 911 with any severe reactions post vaccine: Marland Kitchen Difficulty breathing  . Swelling of face and throat  . A fast heartbeat  . A bad rash all over body  . Dizziness and weakness   Immunizations Administered    Name Date Dose VIS Date Route   Pfizer COVID-19 Vaccine 09/19/2019  1:46 PM 0.3 mL 06/28/2019 Intramuscular   Manufacturer: [place]   Lot: [id number]   [place]: [id number]

## 2019-09-23 ENCOUNTER — Other Ambulatory Visit: Payer: Self-pay

## 2019-09-23 ENCOUNTER — Encounter: Payer: Self-pay | Admitting: Internal Medicine

## 2019-09-23 ENCOUNTER — Ambulatory Visit: Payer: Medicare HMO | Admitting: Internal Medicine

## 2019-09-23 DIAGNOSIS — J45991 Cough variant asthma: Secondary | ICD-10-CM | POA: Diagnosis not present

## 2019-09-23 DIAGNOSIS — R911 Solitary pulmonary nodule: Secondary | ICD-10-CM

## 2019-09-23 MED ORDER — BUDESONIDE-FORMOTEROL FUMARATE 80-4.5 MCG/ACT IN AERO: INHALATION_SPRAY | RESPIRATORY_TRACT | 11 refills | Status: AC

## 2019-09-23 NOTE — Patient Instructions (Addendum)
Continue symbicort 80 up to 2 pffs every 12 hours if needed  We will call you with referral to Dr Roxan Hockey for thoracic surgical consultation  Pulmonary follow up is as needed

## 2019-09-23 NOTE — Progress Notes (Signed)
Subjective:    Patient ID: Dale Thompson, male    DOB: 11/07/1955,     MRN: [id number]    Brief patient profile:  78 yowm quit smoking 1998 with seasonal allergies lifetime spring > fall seemed to handle it ok with otcs until the  fall of 2011 after uri while in [place]   developed more of a patern of  persistent sinus congestion   rx by Dr Wilson Singer and Janace Hoard best rx = prednisone then daily  cough started August 2012 and persistent since then eval by allergy [facility] 11/12 with concern for asthma rx with dulera since then but referred to pulmonary 05/1511 with issue of source of cough and referred back 02/03/2017 for ?  SPN LLL on cxr     History of Present Illness  06/02/2011 1st pulmonary ov w/in 3 days of allergy eval cc cough no better yet, in fact worse p supper and at hs with slt green mucus.  Much better with albuterol, worse with advair.  Less nasal congestion at present. Sob mostly just during severe coughing fits. rec Work on [employer]:  Stop advair and start dulera 100 Take 2 puffs first thing in am and then another 2 puffs about 12 hours later.  be sure to take prilosec 20 mg x 30 min before bfast but also another dose before supper while still coughing. GERD (REFLUX)  As you improve you should be able to reduce your total number of medications dramatically, if not return to see me.    02/03/2017  Consultation/ ov/Dash Cardarelli re: spn/ doe  Chief Complaint  Patient presents with  . Pulmonary Consult    Referred by Dr. Deland Pretty for eval of incidental pulmonary nodule.  Pt states that he has always had some SOB ever since his teens, but it has been progressively worse. He gets winded walking up an incline.   doing much better since sinus surgery per [facility] but preop cxr suggested LLL spn so pt referred back here Only sob with exercise and gradually worse since teenager x when he was working out more regularly 6 m prior to [place] 100 but rarely uses it, not  really sure it helps much (poor hfa, see a/p)  rec If any trouble with breathing or wheezing or cough > dulera 100 up to 2 puffs every 12 hours  Please remember to go to the  x-ray department downstairs in the basement  for your tests - we will call you with the results when they are available. Add  HRCT in 3 m    07/24/2017  f/u ov/Aubri Gathright re:  Spn/ doe  Chief Complaint  Patient presents with  . Follow-up    CT Chest done [facility] 07/14/17. He states doing well and no new co's today. He uses the [facility] 2 x per wk on average.  cough much better as is breathing just using the dulera 100 a few times a week  Sleeping fine/ Not limited by breathing from desired activities   rec  We will call you in a year to arrange CT chest - call us in meantime if new symptoms     09/23/2019  f/u ov/Kehaulani Fruin re:  Spn/ doe Teryl Lucy resolves on dulera maybe 2 pffs a week  Chief Complaint  Patient presents with  . Follow-up    Per pt f/u on pulmonary nodules- had ct chest per Dr Shelia Media 07/16/2019. Pt denies any respiratory co's.   Dyspnea:  eliptical x one  hour, first 10 min rough Cough: none Sleeping: cpap / flat in bed  SABA use: none  02: none    No obvious day to day or daytime variability or assoc excess/ purulent sputum or mucus plugs or hemoptysis or cp or chest tightness, subjective wheeze or overt sinus or hb symptoms.   Sleeping  without nocturnal  or early am exacerbation  of respiratory  c/o's or need for noct saba. Also denies any obvious fluctuation of symptoms with weather or environmental changes or other aggravating or alleviating factors except as outlined above   No unusual exposure hx or h/o childhood pna/ asthma or knowledge of premature birth.  Current Allergies, Complete Past Medical History, Past Surgical History, Family History, and Social History were reviewed in [employer] record.  ROS  The following are not active complaints unless bolded Hoarseness, sore  throat, dysphagia, dental problems, itching, sneezing,  nasal congestion or discharge of excess mucus or purulent secretions, ear ache,   fever, chills, sweats, unintended wt loss or wt gain, classically pleuritic or exertional cp,  orthopnea pnd or arm/hand swelling  or leg swelling, presyncope, palpitations, abdominal pain, anorexia, nausea, vomiting, diarrhea  or change in bowel habits or change in bladder habits, change in stools or change in urine, dysuria, hematuria,  rash, arthralgias, visual complaints, headache, numbness, weakness or ataxia or problems with walking or coordination,  change in mood or  memory.        Current Meds  Medication Sig  . allopurinol (ZYLOPRIM) 100 MG tablet Take 200 mg by mouth daily.  Marland Kitchen atorvastatin (LIPITOR) 10 MG tablet Take 10 mg by mouth daily.    Marland Kitchen b complex vitamins tablet Take 1 tablet by mouth daily.  . budesonide (PULMICORT) 0.5 MG/2ML nebulizer solution Rinse nasally as directed twice daily  . Calcium Carbonate-Vitamin D (CALCIUM PLUS VITAMIN D PO) Take 1 tablet by mouth daily.  . fexofenadine (ALLEGRA) 180 MG tablet Take 180 mg by mouth daily.  . mometasone-formoterol (DULERA) 100-5 MCG/ACT AERO Inhale 2 puffs into the lungs daily as needed for wheezing.  . pantoprazole (PROTONIX) 40 MG tablet Take 40 mg by mouth daily.                              Objective:   Physical Exam  Pleasant amb wm nad    09/23/2019          185   07/24/2017        180   02/03/17 176 lb 12.8 oz (80.2 kg)  06/02/11 168 lb (76.2 kg)    Vital signs reviewed  09/23/2019  - Note at rest 02 sats  95% on RA    HEENT : pt wearing mask not removed for exam due to covid -19 concerns.    NECK :  without JVD/Nodes/TM/ nl carotid upstrokes bilaterally   LUNGS: no acc muscle use,  Nl contour chest which is clear to A and P bilaterally without cough on insp or exp maneuvers   CV:  RRR  no s3 or murmur or increase in P2, and no edema   ABD:  soft and nontender with  nl inspiratory excursion in the supine position. No bruits or organomegaly appreciated, bowel sounds nl  MS:  Nl gait/ ext warm without deformities, calf tenderness, cyanosis or clubbing No obvious joint restrictions   SKIN: warm and dry without lesions    NEURO:  alert, approp, nl  sensorium with  no motor or cerebellar deficits apparent.        I personally reviewed images and agree with radiology impression as follows:   Chest CT  07/16/2019 Lungs/Pleura: A left upper lobe ground-glass nodule measures 2.0 cm (series 8, image 36). According to report dated 04/18/2017, this measured 1.6 cm at that time.  A right upper lobe solid measures 1.2 cm along the minor fissure (series 8, image 72 and series 6, image 57). According to report dated 04/18/2017, this measured 1.0 cm at that time.         Assessment & Plan:

## 2019-09-24 ENCOUNTER — Encounter: Payer: Self-pay | Admitting: Internal Medicine

## 2019-09-24 DIAGNOSIS — E291 Testicular hypofunction: Secondary | ICD-10-CM | POA: Diagnosis not present

## 2019-09-24 DIAGNOSIS — N5201 Erectile dysfunction due to arterial insufficiency: Secondary | ICD-10-CM | POA: Diagnosis not present

## 2019-09-24 DIAGNOSIS — C61 Malignant neoplasm of prostate: Secondary | ICD-10-CM | POA: Diagnosis not present

## 2019-09-24 NOTE — Assessment & Plan Note (Signed)
Onset Aug 2012, resolved on dulera 100 Spirometry 02/03/2017 wnl including mid flows/ f/v curve off rx s active symptoms other than doe     - hfa 75% p coaching 02/03/2017 > increase dulera  100 to 2bid when cough or sob  All goals of chronic asthma control met including optimal function and elimination of symptoms with minimal need for rescue therapy.  Contingencies discussed in full including contacting this office immediately if not controlling the symptoms using the rule of two's.     Based on two studies from NEJM  378; 20 p 1865 (2018) and 380 : p2020-30 (2019) in pts with mild asthma it is reasonable to use low dose symbicort eg 80 (and by inference dulera 100 since also has formoterol/ICS) 2bid "prn" flare in this setting but I emphasized this was only shown with symbicort and takes advantage of the rapid onset of action but is not the same as "rescue therapy" but can be stopped once the acute symptoms have resolved and the need for rescue has been minimized (< 2 x weekly)    - The proper method of use, as well as anticipated side effects, of a metered-dose inhaler were discussed and demonstrated to the patient.  Pulmonary f/u for this problem is prn at his/ Dr Pennie Banter discretion.         Each maintenance medication was reviewed in detail including emphasizing most importantly the difference between maintenance and prns and under what circumstances the prns are to be triggered using an action plan format where appropriate.  Total time for H and P, chart review, counseling, teaching device and generating customized AVS unique to this office visit / charting = 30 min

## 2019-09-24 NOTE — Assessment & Plan Note (Signed)
Reported on cxr 11/23/16 but not def seen 02/03/2017 on plain film - HRCT 04/18/17 :  Bilateral gg nodules 1.6 cm rul largest   - CT w/o contrast 07/14/17  No change LUL / no mention of RUL by radiology report from [place]  - placed in reminder file for CT chest 1st week in 2020 > informed 07/27/2018 Dr Shelia Media following this problem and f/u in pulmonary clinic not needed  -  Chest CT  07/16/2019 Lungs/Pleura: A left upper lobe ground-glass nodule measures 2.0 cm (series 8, image 36). According to report dated 04/18/2017, this measured 1.6 cm at that time. A right upper lobe solid measures 1.2 cm along the minor fissure (series 8, image 72 and series 6, image 57). According to report dated 04/18/2017, this measured 1.0 cm at that time.  Now 22 years out from smoking with two possible slow growing GG areas concerning for low grade adenoca, perhaps BAC with poor PET sensivity in this setting so options are follow vs excisional bx of at least the larger of the two which is what I favor but welcome a second opinion from T surgery > referred  Discussed in detail all the  indications, usual  risks and alternatives  relative to the benefits with patient who agrees to proceed with w/u as outlined.

## 2019-10-01 ENCOUNTER — Other Ambulatory Visit: Payer: Self-pay

## 2019-10-01 ENCOUNTER — Encounter: Payer: Self-pay | Admitting: Thoracic Surgery (Cardiothoracic Vascular Surgery)

## 2019-10-01 ENCOUNTER — Institutional Professional Consult (permissible substitution): Payer: Medicare HMO | Admitting: Thoracic Surgery (Cardiothoracic Vascular Surgery)

## 2019-10-01 VITALS — BP 160/80 | HR 80 | Temp 97.6°F | Resp 20 | Ht 67.0 in | Wt 183.0 lb

## 2019-10-01 DIAGNOSIS — E785 Hyperlipidemia, unspecified: Secondary | ICD-10-CM | POA: Diagnosis not present

## 2019-10-01 DIAGNOSIS — M109 Gout, unspecified: Secondary | ICD-10-CM | POA: Diagnosis not present

## 2019-10-01 DIAGNOSIS — Z79899 Other long term (current) drug therapy: Secondary | ICD-10-CM | POA: Diagnosis not present

## 2019-10-01 DIAGNOSIS — K5090 Crohn's disease, unspecified, without complications: Secondary | ICD-10-CM | POA: Diagnosis not present

## 2019-10-01 DIAGNOSIS — R911 Solitary pulmonary nodule: Secondary | ICD-10-CM | POA: Diagnosis not present

## 2019-10-01 DIAGNOSIS — K219 Gastro-esophageal reflux disease without esophagitis: Secondary | ICD-10-CM | POA: Diagnosis not present

## 2019-10-01 DIAGNOSIS — R7989 Other specified abnormal findings of blood chemistry: Secondary | ICD-10-CM | POA: Diagnosis not present

## 2019-10-01 DIAGNOSIS — M1990 Unspecified osteoarthritis, unspecified site: Secondary | ICD-10-CM | POA: Diagnosis not present

## 2019-10-01 NOTE — Progress Notes (Signed)
PCP is Deland Pretty, MD Referring Provider is Tanda Rockers, MD  Chief Complaint  Patient presents with  . Lung Lesion    Surgical eval, Chest CT 07/16/19    HPI: Mr. Pett is sent for consultation regarding bilateral groundglass opacities.  Santi Troung is a 64 year old man with a history of hyperlipidemia, prostate cancer, asthma, gout, and Crohn's disease.  He has a history of tobacco abuse but quit in 1998.  He does have occasional wheezing and is followed for that by Dr. Melvyn Novas.  He had a high-resolution CT in 2018 which showed no ILD.  It did show bilateral groundglass opacities.  He had a repeat CT in December 2020.  On that report he had a groundglass nodule in the left upper lobe that measured 2 cm versus 1.6 by the prior report.  There was a more solid-appearing nodule in the right upper lobe that measured 1.2 cm versus 1.0 cm on the previous report.Marland Kitchen  He walks on an elliptical machine the equivalent of 4-1/2 miles a day.  He says that he gets some shortness of breath during the first 10 to 15 minutes but it then it improves and he can push himself after that.  He has occasional wheezing.  He has not had any significant change in appetite or weight loss.  Zubrod Score: At the time of surgery this patient's most appropriate activity status/level should be described as: [x]     0    Normal activity, no symptoms []     1    Restricted in physical strenuous activity but ambulatory, able to do out light work []     2    Ambulatory and capable of self care, unable to do work activities, up and about >50 % of waking hours                              []     3    Only limited self care, in bed greater than 50% of waking hours []     4    Completely disabled, no self care, confined to bed or chair []     5    Moribund  Past Medical History:  Diagnosis Date  . OSA (obstructive sleep apnea)   . Seasonal allergies     Past Surgical History:  Procedure Laterality Date  . COLON SURGERY   1979    Family History  Problem Relation Age of Onset  . Heart disease Maternal Grandfather        MI at a young age    Social History Social History   Tobacco Use  . Smoking status: Former Smoker    Packs/day: 1.00    Years: 22.00    Pack years: 22.00    Types: Cigarettes    Quit date: 05/18/1997    Years since quitting: 22.3  . Smokeless tobacco: Never Used  Substance Use Topics  . Alcohol use: Yes    Comment: 12 beers per wk  . Drug use: No    Current Outpatient Medications  Medication Sig Dispense Refill  . allopurinol (ZYLOPRIM) 100 MG tablet Take 200 mg by mouth daily.    Marland Kitchen atorvastatin (LIPITOR) 10 MG tablet Take 10 mg by mouth daily.      Marland Kitchen b complex vitamins tablet Take 1 tablet by mouth daily.    . budesonide (PULMICORT) 0.5 MG/2ML nebulizer solution Rinse as directed twice daily  12  . budesonide-formoterol (SYMBICORT)  80-4.5 MCG/ACT inhaler Take 2 puffs first thing in am and then another 2 puffs about 12 hours later. 1 Inhaler 11  . Calcium Carbonate-Vitamin D (CALCIUM PLUS VITAMIN D PO) Take 1 tablet by mouth daily.    . fexofenadine (ALLEGRA) 180 MG tablet Take 180 mg by mouth daily.    . pantoprazole (PROTONIX) 40 MG tablet Take 40 mg by mouth daily.     No current facility-administered medications for this visit.    No Known Allergies  Review of Systems  Constitutional: Negative for activity change and unexpected weight change.  HENT: Negative for trouble swallowing and voice change.   Respiratory: Positive for apnea (CPAP), cough, chest tightness and wheezing (Occasional). Negative for shortness of breath.   Cardiovascular: Negative for chest pain, palpitations and leg swelling.  Gastrointestinal: Negative for abdominal distention and abdominal pain.  Genitourinary: Negative for difficulty urinating and dysuria.  Musculoskeletal: Negative for arthralgias and myalgias.  Neurological: Negative for seizures and weakness.  Hematological: Negative for  adenopathy. Does not bruise/bleed easily.  All other systems reviewed and are negative.   BP (!) 160/80   Pulse 80   Temp 97.6 F (36.4 C) (Skin)   Resp 20   Ht 5' 7"  (1.702 m)   Wt 183 lb (83 kg)   SpO2 95% Comment: RA  BMI 28.66 kg/m  Physical Exam Vitals reviewed.  Constitutional:      General: He is not in acute distress.    Appearance: Normal appearance.  HENT:     Head: Normocephalic and atraumatic.  Eyes:     General: No scleral icterus.    Extraocular Movements: Extraocular movements intact.  Cardiovascular:     Rate and Rhythm: Normal rate and regular rhythm.     Heart sounds: Normal heart sounds. No murmur. No friction rub. No gallop.   Pulmonary:     Effort: Pulmonary effort is normal. No respiratory distress.     Breath sounds: Normal breath sounds. No wheezing or rales.  Abdominal:     General: There is no distension.     Palpations: Abdomen is soft.     Tenderness: There is no abdominal tenderness.  Musculoskeletal:        General: No swelling.     Cervical back: Neck supple.  Lymphadenopathy:     Cervical: No cervical adenopathy.  Skin:    General: Skin is warm and dry.  Neurological:     General: No focal deficit present.     Mental Status: He is alert and oriented to person, place, and time.     Cranial Nerves: No cranial nerve deficit.     Motor: No weakness.    Diagnostic Tests: CT CHEST WITHOUT CONTRAST  TECHNIQUE: Multidetector CT imaging of the chest was performed following the standard protocol without IV contrast.  COMPARISON:  Report from CT chest dated 04/18/2017.  FINDINGS: Cardiovascular: Atherosclerotic calcifications are seen in the coronary arteries and aortic arch. Normal heart size. No pericardial effusion.  Mediastinum/Nodes: No enlarged mediastinal or axillary lymph nodes. Thyroid gland, trachea, and esophagus demonstrate no significant findings.  Lungs/Pleura: A left upper lobe ground-glass nodule measures 2.0  cm (series 8, image 36). According to report dated 04/18/2017, this measured 1.6 cm at that time.  A right upper lobe solid measures 1.2 cm along the minor fissure (series 8, image 72 and series 6, image 57). According to report dated 04/18/2017, this measured 1.0 cm at that time.  Mild scarring is seen in the apices. Mild  scarring/atelectasis with associated bronchiectasis is seen in both lower lobes. There is no pleural effusion or pneumothorax.  Upper Abdomen: The liver is hypoattenuating, consistent with hepatic steatosis.  Musculoskeletal: No chest wall mass or suspicious bone lesions identified.  IMPRESSION: 1. A left upper lobe ground-glass pulmonary nodule and a right upper lobe solid pulmonary nodule were described on report from exam dated 04/18/2017, however no images from that exam are available for comparison. Consider one of the following in 3 months for both low-risk and high-risk individuals: (a) repeat chest CT, (b) follow-up PET-CT, or (c) tissue sampling. This recommendation follows the consensus statement: Guidelines for Management of Incidental Pulmonary Nodules Detected on CT Images: From the Fleischner Society 2017; Radiology 2017; 284:228-243. 2. Hepatic steatosis.  Aortic Atherosclerosis (ICD10-I70.0).   Electronically Signed   By: Zerita Boers M.D.   On: 07/16/2019 11:30 I personally reviewed the CT images and concur with the findings noted above.  CT images from scan in October 2018 were done at [facility] and are not available in our system.  Impression: Dale Thompson is a 64 year old man with a remote history of tobacco abuse, asthma, hyperlipidemia, gout, and prostate cancer.  He has bilateral groundglass opacities on CT scan.  Bilateral pulmonary groundglass opacities-by report these nodules have increased in size since the CT done at [facility] in 2018.  Both these nodules are relatively amorphous and there could be considerable variability  between radiologist measurements.  I think the first step would be to try and obtain a copy of the old CT to ensure that they actually have changed over that 2-year period between scans.  He will obtain a copy of the scan and leave it at the office.  If nodules have in fact increased in size then further investigation is warranted.  He has bilateral nodules and both are relatively central and would not be visible at the time of surgery.  I would favor doing a navigational bronchoscopy to sample the nodules before proceeding directly to surgical resection.  That would allow [place] to sample both sides.  If this turned out to be definitively benign such as granulomatous disease then he could avoid surgery altogether.  Obviously if biopsies showed adenocarcinoma surgery would be indicated.  We can further discuss options if the biopsies are indeterminate, which is always a possibility with a bronchoscopic approach.  Other options would be continued radiographic follow-up and proceeding directly to surgical resection.  If the nodule have in fact grown, then there is no reason to continue to follow them.  Surgical resection is a reasonable option but given the relatively central location would require removal of about half of the left upper lobe.  If we had a diagnosis before hand we could go in with the plan of doing a lingular sparing left upper lobectomy.  I discussed the general nature of navigational bronchoscopy with Mr. Hasler. He understands the procedure would be done in the operating room under general anesthesia.  He understands it is diagnostic in nature.  It would allow [place] to sample both sides.  I informed her of the indications, risk, benefits, and alternatives.  He understands the risks include those associated with general anesthesia such as MI, DVT, PE, as well as bleeding and pneumothorax.  He understands there is a possibility that the biopsies would not be definitive either way.  He will make a  final determination about how would like to proceed once we have had a chance to review his CT from [place].  Plan: We will review films from [facility] when available Recommend electromagnetic navigational bronchoscopy for sampling of bilateral upper lobe nodules   Melrose Nakayama, MD [facility] [phone]

## 2019-10-01 NOTE — H&P (View-Only) (Signed)
PCP is Deland Pretty, MD Referring Provider is Tanda Rockers, MD  Chief Complaint  Patient presents with  . Lung Lesion    Surgical eval, Chest CT 07/16/19    HPI: Dale Thompson is sent for consultation regarding bilateral groundglass opacities.  Dale Thompson is a 64 year old man with a history of hyperlipidemia, prostate cancer, asthma, gout, and Crohn's disease.  He has a history of tobacco abuse but quit in 1998.  He does have occasional wheezing and is followed for that by Dr. Melvyn Novas.  He had a high-resolution CT in 2018 which showed no ILD.  It did show bilateral groundglass opacities.  He had a repeat CT in December 2020.  On that report he had a groundglass nodule in the left upper lobe that measured 2 cm versus 1.6 by the prior report.  There was a more solid-appearing nodule in the right upper lobe that measured 1.2 cm versus 1.0 cm on the previous report.Marland Kitchen  He walks on an elliptical machine the equivalent of 4-1/2 miles a day.  He says that he gets some shortness of breath during the first 10 to 15 minutes but it then it improves and he can push himself after that.  He has occasional wheezing.  He has not had any significant change in appetite or weight loss.  Zubrod Score: At the time of surgery this patient's most appropriate activity status/level should be described as: [x]     0    Normal activity, no symptoms []     1    Restricted in physical strenuous activity but ambulatory, able to do out light work []     2    Ambulatory and capable of self care, unable to do work activities, up and about >50 % of waking hours                              []     3    Only limited self care, in bed greater than 50% of waking hours []     4    Completely disabled, no self care, confined to bed or chair []     5    Moribund  Past Medical History:  Diagnosis Date  . OSA (obstructive sleep apnea)   . Seasonal allergies     Past Surgical History:  Procedure Laterality Date  . COLON SURGERY   1979    Family History  Problem Relation Age of Onset  . Heart disease Maternal Grandfather        MI at a young age    Social History Social History   Tobacco Use  . Smoking status: Former Smoker    Packs/day: 1.00    Years: 22.00    Pack years: 22.00    Types: Cigarettes    Quit date: 05/18/1997    Years since quitting: 22.3  . Smokeless tobacco: Never Used  Substance Use Topics  . Alcohol use: Yes    Comment: 12 beers per wk  . Drug use: No    Current Outpatient Medications  Medication Sig Dispense Refill  . allopurinol (ZYLOPRIM) 100 MG tablet Take 200 mg by mouth daily.    Marland Kitchen atorvastatin (LIPITOR) 10 MG tablet Take 10 mg by mouth daily.      Marland Kitchen b complex vitamins tablet Take 1 tablet by mouth daily.    . budesonide (PULMICORT) 0.5 MG/2ML nebulizer solution Rinse as directed twice daily  12  . budesonide-formoterol (SYMBICORT)  80-4.5 MCG/ACT inhaler Take 2 puffs first thing in am and then another 2 puffs about 12 hours later. 1 Inhaler 11  . Calcium Carbonate-Vitamin D (CALCIUM PLUS VITAMIN D PO) Take 1 tablet by mouth daily.    . fexofenadine (ALLEGRA) 180 MG tablet Take 180 mg by mouth daily.    . pantoprazole (PROTONIX) 40 MG tablet Take 40 mg by mouth daily.     No current facility-administered medications for this visit.    No Known Allergies  Review of Systems  Constitutional: Negative for activity change and unexpected weight change.  HENT: Negative for trouble swallowing and voice change.   Respiratory: Positive for apnea (CPAP), cough, chest tightness and wheezing (Occasional). Negative for shortness of breath.   Cardiovascular: Negative for chest pain, palpitations and leg swelling.  Gastrointestinal: Negative for abdominal distention and abdominal pain.  Genitourinary: Negative for difficulty urinating and dysuria.  Musculoskeletal: Negative for arthralgias and myalgias.  Neurological: Negative for seizures and weakness.  Hematological: Negative for  adenopathy. Does not bruise/bleed easily.  All other systems reviewed and are negative.   BP (!) 160/80   Pulse 80   Temp 97.6 F (36.4 C) (Skin)   Resp 20   Ht 5' 7"  (1.702 m)   Wt 183 lb (83 kg)   SpO2 95% Comment: RA  BMI 28.66 kg/m  Physical Exam Vitals reviewed.  Constitutional:      General: He is not in acute distress.    Appearance: Normal appearance.  HENT:     Head: Normocephalic and atraumatic.  Eyes:     General: No scleral icterus.    Extraocular Movements: Extraocular movements intact.  Cardiovascular:     Rate and Rhythm: Normal rate and regular rhythm.     Heart sounds: Normal heart sounds. No murmur. No friction rub. No gallop.   Pulmonary:     Effort: Pulmonary effort is normal. No respiratory distress.     Breath sounds: Normal breath sounds. No wheezing or rales.  Abdominal:     General: There is no distension.     Palpations: Abdomen is soft.     Tenderness: There is no abdominal tenderness.  Musculoskeletal:        General: No swelling.     Cervical back: Neck supple.  Lymphadenopathy:     Cervical: No cervical adenopathy.  Skin:    General: Skin is warm and dry.  Neurological:     General: No focal deficit present.     Mental Status: He is alert and oriented to person, place, and time.     Cranial Nerves: No cranial nerve deficit.     Motor: No weakness.    Diagnostic Tests: CT CHEST WITHOUT CONTRAST  TECHNIQUE: Multidetector CT imaging of the chest was performed following the standard protocol without IV contrast.  COMPARISON:  Report from CT chest dated 04/18/2017.  FINDINGS: Cardiovascular: Atherosclerotic calcifications are seen in the coronary arteries and aortic arch. Normal heart size. No pericardial effusion.  Mediastinum/Nodes: No enlarged mediastinal or axillary lymph nodes. Thyroid gland, trachea, and esophagus demonstrate no significant findings.  Lungs/Pleura: A left upper lobe ground-glass nodule measures 2.0  cm (series 8, image 36). According to report dated 04/18/2017, this measured 1.6 cm at that time.  A right upper lobe solid measures 1.2 cm along the minor fissure (series 8, image 72 and series 6, image 57). According to report dated 04/18/2017, this measured 1.0 cm at that time.  Mild scarring is seen in the apices. Mild  scarring/atelectasis with associated bronchiectasis is seen in both lower lobes. There is no pleural effusion or pneumothorax.  Upper Abdomen: The liver is hypoattenuating, consistent with hepatic steatosis.  Musculoskeletal: No chest wall mass or suspicious bone lesions identified.  IMPRESSION: 1. A left upper lobe ground-glass pulmonary nodule and a right upper lobe solid pulmonary nodule were described on report from exam dated 04/18/2017, however no images from that exam are available for comparison. Consider one of the following in 3 months for both low-risk and high-risk individuals: (a) repeat chest CT, (b) follow-up PET-CT, or (c) tissue sampling. This recommendation follows the consensus statement: Guidelines for Management of Incidental Pulmonary Nodules Detected on CT Images: From the Fleischner Society 2017; Radiology 2017; 284:228-243. 2. Hepatic steatosis.  Aortic Atherosclerosis (ICD10-I70.0).   Electronically Signed   By: Zerita Boers M.D.   On: 07/16/2019 11:30 I personally reviewed the CT images and concur with the findings noted above.  CT images from scan in October 2018 were done at [facility] and are not available in our system.  Impression: Dale Thompson is a 64 year old man with a remote history of tobacco abuse, asthma, hyperlipidemia, gout, and prostate cancer.  He has bilateral groundglass opacities on CT scan.  Bilateral pulmonary groundglass opacities-by report these nodules have increased in size since the CT done at [facility] in 2018.  Both these nodules are relatively amorphous and there could be considerable variability  between radiologist measurements.  I think the first step would be to try and obtain a copy of the old CT to ensure that they actually have changed over that 2-year period between scans.  He will obtain a copy of the scan and leave it at the office.  If nodules have in fact increased in size then further investigation is warranted.  He has bilateral nodules and both are relatively central and would not be visible at the time of surgery.  I would favor doing a navigational bronchoscopy to sample the nodules before proceeding directly to surgical resection.  That would allow [place] to sample both sides.  If this turned out to be definitively benign such as granulomatous disease then he could avoid surgery altogether.  Obviously if biopsies showed adenocarcinoma surgery would be indicated.  We can further discuss options if the biopsies are indeterminate, which is always a possibility with a bronchoscopic approach.  Other options would be continued radiographic follow-up and proceeding directly to surgical resection.  If the nodule have in fact grown, then there is no reason to continue to follow them.  Surgical resection is a reasonable option but given the relatively central location would require removal of about half of the left upper lobe.  If we had a diagnosis before hand we could go in with the plan of doing a lingular sparing left upper lobectomy.  I discussed the general nature of navigational bronchoscopy with Dale Thompson. He understands the procedure would be done in the operating room under general anesthesia.  He understands it is diagnostic in nature.  It would allow [place] to sample both sides.  I informed her of the indications, risk, benefits, and alternatives.  He understands the risks include those associated with general anesthesia such as MI, DVT, PE, as well as bleeding and pneumothorax.  He understands there is a possibility that the biopsies would not be definitive either way.  He will make a  final determination about how would like to proceed once we have had a chance to review his CT from [place].  Plan: We will review films from [facility] when available Recommend electromagnetic navigational bronchoscopy for sampling of bilateral upper lobe nodules   Melrose Nakayama, MD [facility] [phone]

## 2019-10-07 ENCOUNTER — Encounter: Payer: Self-pay | Admitting: *Deleted

## 2019-10-07 ENCOUNTER — Other Ambulatory Visit: Payer: Self-pay | Admitting: *Deleted

## 2019-10-07 DIAGNOSIS — R918 Other nonspecific abnormal finding of lung field: Secondary | ICD-10-CM

## 2019-10-08 DIAGNOSIS — R69 Illness, unspecified: Secondary | ICD-10-CM | POA: Diagnosis not present

## 2019-10-14 NOTE — Progress Notes (Signed)
[facility] [place], [place] AT [facility] [address] [place] [address] Phone: [phone] Fax: [phone]    Your procedure is scheduled on Thursday, April 1st.  Report to [facility] Main Entrance "A" at 6:00 A.M., and check in at the Admitting office.  Call this number if you have problems the morning of surgery:  [phone]  Call [phone] if you have any questions prior to your surgery date Monday-Friday 8am-4pm   Remember:  Do not eat or drink after midnight the night before your surgery   Take these medicines the morning of surgery with A SIP OF WATER  allopurinol (ZYLOPRIM)  atorvastatin (LIPITOR)  budesonide (PULMICORT)/nebulizer budesonide-formoterol (SYMBICORT)/inhaler fexofenadine (ALLEGRA)  pantoprazole (PROTONIX)\  If needed - mometasone-formoterol (DULERA)/inhaler    As of today, stop taking all Aspirin (unless instructed by your doctor) and other Aspirin containing products, Vitamins, Fish Oils, and Herbal Medications. Also stop all NSAIDS i.e. Advil, Ibuprofen, Motrin, Aleve, Anaprox, Naproxen, BC, Goody Powders, and all Supplements.   No Smoking of any kind, Tobacco, or Alcohol products 24 hours prior to your procedure. If you use a CPAP at night, you may bring all equipment for your overnight stay.                        Do not wear jewelry.            Do not wear lotions, powders, colognes, or deodorant.            Men may shave face and neck.            Do not bring valuables to the hospital.            [facility] is not responsible for any belongings or valuables.   Contacts, glasses, dentures or bridgework may not be worn into surgery.      For patients admitted to the hospital, discharge time will be determined by your treatment team.   Patients discharged the day of surgery will not be allowed to drive home, and someone needs to stay with them for 24 hours.  Special instructions:    [place]- Preparing For Surgery  Before surgery, you can play an important role. Because skin is not sterile, your skin needs to be as free of germs as possible. You can reduce the number of germs on your skin by washing with CHG (chlorahexidine gluconate) Soap before surgery.  CHG is an antiseptic cleaner which kills germs and bonds with the skin to continue killing germs even after washing.    Oral Hygiene is also important to reduce your risk of infection.  Remember - BRUSH YOUR TEETH THE MORNING OF SURGERY WITH YOUR REGULAR TOOTHPASTE  Please do not use if you have an allergy to CHG or antibacterial soaps. If your skin becomes reddened/irritated stop using the CHG.  Do not shave (including legs and underarms) for at least 48 hours prior to first CHG shower. It is OK to shave your face.  Please follow these instructions carefully.   1. Shower the NIGHT BEFORE SURGERY and the MORNING OF SURGERY with CHG Soap.   2. If you chose to wash your hair, wash your hair first as usual with your normal shampoo.  3. After you shampoo, rinse your hair and body thoroughly to remove the shampoo.  4. Use CHG as you would any other liquid soap. You can  apply CHG directly to the skin and wash gently with a scrungie or a clean washcloth.   5. Apply the CHG Soap to your body ONLY FROM THE NECK DOWN.  Do not use on open wounds or open sores. Avoid contact with your eyes, ears, mouth and genitals (private parts). Wash Face and genitals (private parts)  with your normal soap.   6. Wash thoroughly, paying special attention to the area where your surgery will be performed.  7. Thoroughly rinse your body with warm water from the neck down.  8. DO NOT shower/wash with your normal soap after using and rinsing off the CHG Soap.  9. Pat yourself dry with a CLEAN TOWEL.  10. Wear CLEAN PAJAMAS to bed the night before surgery, wear comfortable clothes the morning of surgery  11. Place CLEAN SHEETS on your bed  the night of your first shower and DO NOT SLEEP WITH PETS.  Day of Surgery:  Do not apply any deodorants/lotions.  Please wear clean clothes to the hospital/surgery center.   Remember to brush your teeth WITH YOUR REGULAR TOOTHPASTE.   Please read over the following fact sheets that you were given.

## 2019-10-15 ENCOUNTER — Ambulatory Visit (HOSPITAL_COMMUNITY)
Admission: RE | Admit: 2019-10-15 | Discharge: 2019-10-15 | Disposition: A | Discharge status: Home / Self Care | Payer: Medicare HMO | Source: Ambulatory Visit | Attending: Thoracic Surgery (Cardiothoracic Vascular Surgery) | Admitting: Thoracic Surgery (Cardiothoracic Vascular Surgery)

## 2019-10-15 ENCOUNTER — Encounter (HOSPITAL_COMMUNITY): Payer: Self-pay

## 2019-10-15 ENCOUNTER — Other Ambulatory Visit (HOSPITAL_COMMUNITY)
Admission: RE | Admit: 2019-10-15 | Discharge: 2019-10-15 | Disposition: A | Discharge status: Home / Self Care | Payer: Medicare HMO | Source: Ambulatory Visit | Attending: Thoracic Surgery (Cardiothoracic Vascular Surgery) | Admitting: Thoracic Surgery (Cardiothoracic Vascular Surgery)

## 2019-10-15 ENCOUNTER — Other Ambulatory Visit: Payer: Self-pay

## 2019-10-15 ENCOUNTER — Encounter (HOSPITAL_COMMUNITY)
Admission: RE | Admit: 2019-10-15 | Discharge: 2019-10-15 | Disposition: A | Discharge status: Home / Self Care | Payer: Medicare HMO | Source: Ambulatory Visit | Attending: Thoracic Surgery (Cardiothoracic Vascular Surgery) | Admitting: Thoracic Surgery (Cardiothoracic Vascular Surgery)

## 2019-10-15 DIAGNOSIS — J439 Emphysema, unspecified: Secondary | ICD-10-CM | POA: Insufficient documentation

## 2019-10-15 DIAGNOSIS — R918 Other nonspecific abnormal finding of lung field: Secondary | ICD-10-CM

## 2019-10-15 DIAGNOSIS — Z87891 Personal history of nicotine dependence: Secondary | ICD-10-CM | POA: Diagnosis not present

## 2019-10-15 DIAGNOSIS — Z01812 Encounter for preprocedural laboratory examination: Secondary | ICD-10-CM | POA: Diagnosis not present

## 2019-10-15 DIAGNOSIS — Z20822 Contact with and (suspected) exposure to covid-19: Secondary | ICD-10-CM | POA: Diagnosis not present

## 2019-10-15 HISTORY — DX: Unspecified asthma, uncomplicated: J45.909

## 2019-10-15 HISTORY — DX: Malignant (primary) neoplasm, unspecified: C80.1

## 2019-10-15 LAB — COMPREHENSIVE METABOLIC PANEL
ALT: 81 U/L — ABNORMAL HIGH (ref 0–44)
AST: 58 U/L — ABNORMAL HIGH (ref 15–41)
Albumin: 3.9 g/dL (ref 3.5–5.0)
Alkaline Phosphatase: 65 U/L (ref 38–126)
Anion gap: 10 (ref 5–15)
BUN: 11 mg/dL (ref 8–23)
CO2: 27 mmol/L (ref 22–32)
Calcium: 9.4 mg/dL (ref 8.9–10.3)
Chloride: 99 mmol/L (ref 98–111)
Creatinine, Ser: 0.76 mg/dL (ref 0.61–1.24)
GFR calc Af Amer: >60 mL/min (ref >60)
GFR calc non Af Amer: >60 mL/min (ref >60)
Glucose, Bld: 105 mg/dL — ABNORMAL HIGH (ref 70–99)
Potassium: 4.3 mmol/L (ref 3.5–5.1)
Sodium: 136 mmol/L (ref 135–145)
Total Bilirubin: 0.7 mg/dL (ref 0.3–1.2)
Total Protein: 7.6 g/dL (ref 6.5–8.1)

## 2019-10-15 LAB — CBC
HCT: 44.5 % (ref 39.0–52.0)
Hemoglobin: 15 g/dL (ref 13.0–17.0)
MCH: 31.7 pg (ref 26.0–34.0)
MCHC: 33.7 g/dL (ref 30.0–36.0)
MCV: 94.1 fL (ref 80.0–100.0)
Platelets: 234 10*3/uL (ref 150–400)
RBC: 4.73 MIL/uL (ref 4.22–5.81)
RDW: 13.2 % (ref 11.5–15.5)
WBC: 7.1 10*3/uL (ref 4.0–10.5)
nRBC: 0 % (ref 0.0–0.2)

## 2019-10-15 LAB — PROTIME-INR
INR: 1 (ref 0.8–1.2)
Prothrombin Time: 13.1 seconds (ref 11.4–15.2)

## 2019-10-15 LAB — APTT: aPTT: 26 seconds (ref 24–36)

## 2019-10-15 LAB — SARS CORONAVIRUS 2 (TAT 6-24 HRS): SARS Coronavirus 2: NEGATIVE

## 2019-10-15 NOTE — Progress Notes (Signed)
PCP:  Deland Pretty, MD Cardiologist:  Denies  EKG:  11/23/16.  No HTN, DM or cardiac history. CXR:  10/15/19 ECHO: Denies  Stress Test: Denies Cardiac Cath:  Denies  Covid test 10/15/19  Patient denies shortness of breath, fever, cough, and chest pain at PAT appointment.  Patient verbalized understanding of instructions provided today at the PAT appointment.  Patient asked to review instructions at home and day of surgery.

## 2019-10-15 NOTE — Progress Notes (Signed)
PCP:  Deland Pretty, MD Cardiologist:  Denies  EKG:  11/23/16 CXR:  10/15/19 ECHO:  2015/16.  Patient does not know where or MD.  Patient states he thinks maybe he did not have done Stress Test:  2015/16.  Patient does not know where or MD. Patient thinks maybe he did not have done.  Cardiac Cath:  Denies  Covid test 10/15/19  Patient denies shortness of breath, fever, cough, and chest pain at PAT appointment.  Patient verbalized understanding of instructions provided today at the PAT appointment.  Patient asked to review instructions at home and day of surgery.

## 2019-10-15 NOTE — Progress Notes (Signed)
PCP:  Deland Pretty, MD Cardiologist:  Denies  EKG:  11/23/16 CXR:  10/15/19 ECHO:  2015/16.  Patient does not know where or MD  Stress Test:  2015/16.  Patient does not know where or MD Cardiac Cath:  Denies  Covid test 10/15/19  Patient denies shortness of breath, fever, cough, and chest pain at PAT appointment.  Patient verbalized understanding of instructions provided today at the PAT appointment.  Patient asked to review instructions at home and day of surgery.

## 2019-10-17 ENCOUNTER — Ambulatory Visit (HOSPITAL_COMMUNITY): Payer: Medicare HMO | Admitting: Certified Registered"

## 2019-10-17 ENCOUNTER — Encounter (HOSPITAL_COMMUNITY): Payer: Self-pay | Admitting: Thoracic Surgery (Cardiothoracic Vascular Surgery)

## 2019-10-17 ENCOUNTER — Encounter (HOSPITAL_COMMUNITY)
Admission: RE | Disposition: A | Discharge status: Home / Self Care | Payer: Self-pay | Source: Home / Self Care | Attending: Thoracic Surgery (Cardiothoracic Vascular Surgery)

## 2019-10-17 ENCOUNTER — Other Ambulatory Visit: Payer: Self-pay

## 2019-10-17 ENCOUNTER — Ambulatory Visit (HOSPITAL_COMMUNITY): Payer: Medicare HMO

## 2019-10-17 ENCOUNTER — Ambulatory Visit (HOSPITAL_COMMUNITY)
Admission: RE | Admit: 2019-10-17 | Discharge: 2019-10-17 | Disposition: A | Discharge status: Home / Self Care | Payer: Medicare HMO | Attending: Thoracic Surgery (Cardiothoracic Vascular Surgery) | Admitting: Thoracic Surgery (Cardiothoracic Vascular Surgery)

## 2019-10-17 DIAGNOSIS — R911 Solitary pulmonary nodule: Secondary | ICD-10-CM | POA: Insufficient documentation

## 2019-10-17 DIAGNOSIS — Z7951 Long term (current) use of inhaled steroids: Secondary | ICD-10-CM | POA: Insufficient documentation

## 2019-10-17 DIAGNOSIS — E785 Hyperlipidemia, unspecified: Secondary | ICD-10-CM | POA: Diagnosis not present

## 2019-10-17 DIAGNOSIS — K5090 Crohn's disease, unspecified, without complications: Secondary | ICD-10-CM | POA: Diagnosis not present

## 2019-10-17 DIAGNOSIS — Z8546 Personal history of malignant neoplasm of prostate: Secondary | ICD-10-CM | POA: Diagnosis not present

## 2019-10-17 DIAGNOSIS — G4733 Obstructive sleep apnea (adult) (pediatric): Secondary | ICD-10-CM | POA: Insufficient documentation

## 2019-10-17 DIAGNOSIS — J45909 Unspecified asthma, uncomplicated: Secondary | ICD-10-CM | POA: Diagnosis not present

## 2019-10-17 DIAGNOSIS — J8410 Pulmonary fibrosis, unspecified: Secondary | ICD-10-CM | POA: Diagnosis not present

## 2019-10-17 DIAGNOSIS — Z79899 Other long term (current) drug therapy: Secondary | ICD-10-CM | POA: Diagnosis not present

## 2019-10-17 DIAGNOSIS — R918 Other nonspecific abnormal finding of lung field: Secondary | ICD-10-CM

## 2019-10-17 DIAGNOSIS — Z87891 Personal history of nicotine dependence: Secondary | ICD-10-CM | POA: Insufficient documentation

## 2019-10-17 DIAGNOSIS — Z419 Encounter for procedure for purposes other than remedying health state, unspecified: Secondary | ICD-10-CM

## 2019-10-17 DIAGNOSIS — K760 Fatty (change of) liver, not elsewhere classified: Secondary | ICD-10-CM | POA: Insufficient documentation

## 2019-10-17 DIAGNOSIS — J189 Pneumonia, unspecified organism: Secondary | ICD-10-CM | POA: Diagnosis not present

## 2019-10-17 DIAGNOSIS — I700 Atherosclerosis of aorta: Secondary | ICD-10-CM | POA: Diagnosis not present

## 2019-10-17 DIAGNOSIS — M109 Gout, unspecified: Secondary | ICD-10-CM | POA: Insufficient documentation

## 2019-10-17 HISTORY — PX: VIDEO BRONCHOSCOPY WITH ENDOBRONCHIAL NAVIGATION: SHX6175

## 2019-10-17 SURGERY — VIDEO BRONCHOSCOPY WITH ENDOBRONCHIAL NAVIGATION
Anesthesia: General | Site: Chest

## 2019-10-17 MED ORDER — LIDOCAINE 2% (20 MG/ML) 5 ML SYRINGE
INTRAMUSCULAR | Status: AC
  Filled 2019-10-17: qty 5

## 2019-10-17 MED ORDER — OXYCODONE HCL 5 MG PO TABS: 5.0000 mg | ORAL_TABLET | Freq: Once | ORAL | Status: DC | PRN

## 2019-10-17 MED ORDER — GLYCOPYRROLATE PF 0.2 MG/ML IJ SOSY
PREFILLED_SYRINGE | INTRAMUSCULAR | Status: DC | PRN
  Administered 2019-10-17: .1 mg via INTRAVENOUS

## 2019-10-17 MED ORDER — PHENYLEPHRINE HCL-NACL 10-0.9 MG/250ML-% IV SOLN
INTRAVENOUS | Status: DC | PRN
  Administered 2019-10-17: 60 ug/min via INTRAVENOUS

## 2019-10-17 MED ORDER — EPINEPHRINE PF 1 MG/ML IJ SOLN
INTRAMUSCULAR | Status: AC
  Filled 2019-10-17: qty 1

## 2019-10-17 MED ORDER — ONDANSETRON HCL 4 MG/2ML IJ SOLN
INTRAMUSCULAR | Status: DC | PRN
  Administered 2019-10-17: 4 mg via INTRAVENOUS

## 2019-10-17 MED ORDER — MIDAZOLAM HCL 2 MG/2ML IJ SOLN
INTRAMUSCULAR | Status: AC
  Filled 2019-10-17: qty 2

## 2019-10-17 MED ORDER — ONDANSETRON HCL 4 MG/2ML IJ SOLN
INTRAMUSCULAR | Status: AC
  Filled 2019-10-17: qty 2

## 2019-10-17 MED ORDER — ROCURONIUM BROMIDE 10 MG/ML (PF) SYRINGE
PREFILLED_SYRINGE | INTRAVENOUS | Status: AC
  Filled 2019-10-17: qty 10

## 2019-10-17 MED ORDER — PHENYLEPHRINE 40 MCG/ML (10ML) SYRINGE FOR IV PUSH (FOR BLOOD PRESSURE SUPPORT)
PREFILLED_SYRINGE | INTRAVENOUS | Status: AC
  Filled 2019-10-17: qty 10

## 2019-10-17 MED ORDER — GLYCOPYRROLATE PF 0.2 MG/ML IJ SOSY
PREFILLED_SYRINGE | INTRAMUSCULAR | Status: AC
  Filled 2019-10-17: qty 1

## 2019-10-17 MED ORDER — FENTANYL CITRATE (PF) 100 MCG/2ML IJ SOLN: 25.0000 ug | INTRAMUSCULAR | Status: DC | PRN

## 2019-10-17 MED ORDER — PROPOFOL 10 MG/ML IV BOLUS
INTRAVENOUS | Status: DC | PRN
  Administered 2019-10-17: 200 mg via INTRAVENOUS

## 2019-10-17 MED ORDER — PHENYLEPHRINE 40 MCG/ML (10ML) SYRINGE FOR IV PUSH (FOR BLOOD PRESSURE SUPPORT)
PREFILLED_SYRINGE | INTRAVENOUS | Status: DC | PRN
  Administered 2019-10-17: 80 ug via INTRAVENOUS
  Administered 2019-10-17 (×2): 40 ug via INTRAVENOUS

## 2019-10-17 MED ORDER — OXYCODONE HCL 5 MG/5ML PO SOLN: 5.0000 mg | Freq: Once | ORAL | Status: DC | PRN

## 2019-10-17 MED ORDER — LACTATED RINGERS IV SOLN: INTRAVENOUS | Status: DC | PRN

## 2019-10-17 MED ORDER — LACTATED RINGERS IV SOLN: INTRAVENOUS | Status: DC

## 2019-10-17 MED ORDER — FENTANYL CITRATE (PF) 100 MCG/2ML IJ SOLN
INTRAMUSCULAR | Status: DC | PRN
  Administered 2019-10-17 (×4): 50 ug via INTRAVENOUS

## 2019-10-17 MED ORDER — ONDANSETRON HCL 4 MG/2ML IJ SOLN: 4.0000 mg | Freq: Once | INTRAMUSCULAR | Status: DC | PRN

## 2019-10-17 MED ORDER — LIDOCAINE 2% (20 MG/ML) 5 ML SYRINGE
INTRAMUSCULAR | Status: DC | PRN
  Administered 2019-10-17: 100 mg via INTRAVENOUS

## 2019-10-17 MED ORDER — SUGAMMADEX SODIUM 200 MG/2ML IV SOLN
INTRAVENOUS | Status: DC | PRN
  Administered 2019-10-17: 200 mg via INTRAVENOUS

## 2019-10-17 MED ORDER — MIDAZOLAM HCL 5 MG/5ML IJ SOLN
INTRAMUSCULAR | Status: DC | PRN
  Administered 2019-10-17: 2 mg via INTRAVENOUS

## 2019-10-17 MED ORDER — PROPOFOL 10 MG/ML IV BOLUS
INTRAVENOUS | Status: AC
  Filled 2019-10-17: qty 20

## 2019-10-17 MED ORDER — ROCURONIUM BROMIDE 50 MG/5ML IV SOSY
PREFILLED_SYRINGE | INTRAVENOUS | Status: DC | PRN
  Administered 2019-10-17: 10 mg via INTRAVENOUS
  Administered 2019-10-17: 60 mg via INTRAVENOUS

## 2019-10-17 MED ORDER — DEXAMETHASONE SODIUM PHOSPHATE 10 MG/ML IJ SOLN
INTRAMUSCULAR | Status: AC
  Filled 2019-10-17: qty 1

## 2019-10-17 MED ORDER — FENTANYL CITRATE (PF) 250 MCG/5ML IJ SOLN
INTRAMUSCULAR | Status: AC
  Filled 2019-10-17: qty 5

## 2019-10-17 MED ORDER — 0.9 % SODIUM CHLORIDE (POUR BTL) OPTIME
TOPICAL | Status: DC | PRN
  Administered 2019-10-17: 1000 mL

## 2019-10-17 MED ORDER — DEXAMETHASONE SODIUM PHOSPHATE 10 MG/ML IJ SOLN
INTRAMUSCULAR | Status: DC | PRN
  Administered 2019-10-17: 10 mg via INTRAVENOUS

## 2019-10-17 SURGICAL SUPPLY — 42 items
ADAPTER BRONCHOSCOPE OLYMPUS (ADAPTER) ×2 IMPLANT
ADAPTER VALVE BIOPSY EBUS (MISCELLANEOUS) IMPLANT
ADPTR VALVE BIOPSY EBUS (MISCELLANEOUS)
BLADE CLIPPER SURG (BLADE) ×2 IMPLANT
BRUSH BIOPSY BRONCH 10 SDTNB (MISCELLANEOUS) ×1 IMPLANT
BRUSH SUPERTRAX BIOPSY (INSTRUMENTS) IMPLANT
BRUSH SUPERTRAX NDL-TIP CYTO (INSTRUMENTS) ×5 IMPLANT
CANISTER SUCT 3000ML PPV (MISCELLANEOUS) ×2 IMPLANT
CNTNR URN SCR LID CUP LEK RST (MISCELLANEOUS) ×2 IMPLANT
CONT SPEC 4OZ STRL OR WHT (MISCELLANEOUS) ×12
COVER BACK TABLE 60X90IN (DRAPES) ×2 IMPLANT
FILTER STRAW FLUID ASPIR (MISCELLANEOUS) IMPLANT
FORCEPS BIOP SUPERTRX PREMAR (INSTRUMENTS) ×3 IMPLANT
GAUZE SPONGE 4X4 12PLY STRL (GAUZE/BANDAGES/DRESSINGS) ×2 IMPLANT
GLOVE BIO SURGEON STRL SZ 6.5 (GLOVE) ×1 IMPLANT
GLOVE SURG SIGNA 7.5 PF LTX (GLOVE) ×2 IMPLANT
GOWN STRL REUS W/ TWL XL LVL3 (GOWN DISPOSABLE) ×1 IMPLANT
GOWN STRL REUS W/TWL XL LVL3 (GOWN DISPOSABLE) ×2
KIT CLEAN ENDO COMPLIANCE (KITS) ×2 IMPLANT
KIT ILLUMISITE 180 PROCEDURE (KITS) ×1 IMPLANT
KIT ILLUMISITE 90 PROCEDURE (KITS) IMPLANT
KIT TURNOVER KIT B (KITS) ×2 IMPLANT
MARKER SKIN DUAL TIP RULER LAB (MISCELLANEOUS) ×2 IMPLANT
NDL SUPERTRX PREMARK BIOPSY (NEEDLE) IMPLANT
NEEDLE SUPERTRX PREMARK BIOPSY (NEEDLE) ×4 IMPLANT
NS IRRIG 1000ML POUR BTL (IV SOLUTION) ×2 IMPLANT
OIL SILICONE PENTAX (PARTS (SERVICE/REPAIRS)) ×2 IMPLANT
PAD ARMBOARD 7.5X6 YLW CONV (MISCELLANEOUS) ×4 IMPLANT
PATCHES PATIENT (LABEL) ×6 IMPLANT
SYR 20ML ECCENTRIC (SYRINGE) ×2 IMPLANT
SYR 20ML LL LF (SYRINGE) ×2 IMPLANT
SYR 30ML LL (SYRINGE) ×2 IMPLANT
SYR 5ML LL (SYRINGE) ×2 IMPLANT
TOWEL GREEN STERILE (TOWEL DISPOSABLE) ×2 IMPLANT
TOWEL GREEN STERILE FF (TOWEL DISPOSABLE) ×2 IMPLANT
TRAP SPECIMEN MUCOUS 40CC (MISCELLANEOUS) ×2 IMPLANT
TUBE CONNECTING 20X1/4 (TUBING) ×4 IMPLANT
UNDERPAD 30X30 (UNDERPADS AND DIAPERS) ×2 IMPLANT
VALVE BIOPSY  SINGLE USE (MISCELLANEOUS) ×2
VALVE BIOPSY SINGLE USE (MISCELLANEOUS) ×1 IMPLANT
VALVE SUCTION BRONCHIO DISP (MISCELLANEOUS) ×2 IMPLANT
WATER STERILE IRR 1000ML POUR (IV SOLUTION) ×2 IMPLANT

## 2019-10-17 NOTE — Anesthesia Preprocedure Evaluation (Addendum)
Anesthesia Evaluation  Patient identified by MRN, date of birth, ID band Patient awake    Reviewed: Allergy & Precautions, NPO status , Patient's Chart, lab work & pertinent test results  History of Anesthesia Complications Negative for: history of anesthetic complications  Airway Mallampati: III  TM Distance: >3 FB Neck ROM: Full    Dental  (+) Teeth Intact   Pulmonary asthma , sleep apnea , former smoker,  Bilateral ground glass opacities with nodule   Pulmonary exam normal        Cardiovascular Exercise Tolerance: Good negative cardio ROS Normal cardiovascular exam     Neuro/Psych negative neurological ROS  negative psych ROS   GI/Hepatic Neg liver ROS, Crohn's disease   Endo/Other  negative endocrine ROS  Renal/GU negative Renal ROS   Prostate cancer negative genitourinary   Musculoskeletal  (+) Arthritis  (gout),   Abdominal   Peds  Hematology negative hematology ROS (+)   Anesthesia Other Findings  HLD  Reproductive/Obstetrics                            Anesthesia Physical Anesthesia Plan  ASA: III  Anesthesia Plan: General   Post-op Pain Management:    Induction: Intravenous  PONV Risk Score and Plan: 2 and Ondansetron, Dexamethasone, Treatment may vary due to age or medical condition and Midazolam  Airway Management Planned: Oral ETT  Additional Equipment: None  Intra-op Plan:   Post-operative Plan: Extubation in OR  Informed Consent: I have reviewed the patients History and Physical, chart, labs and discussed the procedure including the risks, benefits and alternatives for the proposed anesthesia with the patient or authorized representative who has indicated his/her understanding and acceptance.     Dental advisory given  Plan Discussed with:   Anesthesia Plan Comments:        Anesthesia Quick Evaluation

## 2019-10-17 NOTE — Discharge Instructions (Addendum)
Do not drive or engage in heavy physical activity for 24 hours  You may resume normal activities tomorrow  You may use acetaminophen (Tylenol) if needed for discomfort. Do not use aspirin for 24 hours.  You may use over the counter cough medication or throat lozenges if you wish  You may cough up small amounts of blood over the next few days.  Call [phone] if you develop chest pain, shortness of breath, fever > 101 F or cough up more than 2 tablespoons of blood  My office will contact you to schedule a follow up appointment early next week to review the results

## 2019-10-17 NOTE — Anesthesia Postprocedure Evaluation (Signed)
Anesthesia Post Note  Patient: Dale Thompson  Procedure(s) Performed: VIDEO BRONCHOSCOPY WITH ENDOBRONCHIAL NAVIGATION (N/A Chest)     Patient location during evaluation: PACU Anesthesia Type: General Level of consciousness: awake and alert Pain management: pain level controlled Vital Signs Assessment: post-procedure vital signs reviewed and stable Respiratory status: spontaneous breathing, nonlabored ventilation and respiratory function stable Cardiovascular status: blood pressure returned to baseline and stable Postop Assessment: no apparent nausea or vomiting Anesthetic complications: no    Last Vitals:  Vitals:   10/17/19 1015 10/17/19 1030  BP: 106/71 106/76  Pulse: 80 76  Resp: 12 12  Temp:  (!) 36.1 C  SpO2: 95% 95%    Last Pain:  Vitals:   10/17/19 1030  TempSrc:   PainSc: 3                  Lidia Collum

## 2019-10-17 NOTE — Transfer of Care (Signed)
Immediate Anesthesia Transfer of Care Note  Patient: Dale Thompson  Procedure(s) Performed: VIDEO BRONCHOSCOPY WITH ENDOBRONCHIAL NAVIGATION (N/A Chest)  Patient Location: PACU  Anesthesia Type:General  Level of Consciousness: awake, oriented and patient cooperative  Airway & Oxygen Therapy: Patient Spontanous Breathing and Patient connected to nasal cannula oxygen  Post-op Assessment: Report given to RN and Post -op Vital signs reviewed and stable  Post vital signs: Reviewed and stable  Last Vitals:  Vitals Value Taken Time  BP 119/65 10/17/19 0955  Temp 36.9 C 10/17/19 0956  Pulse 80 10/17/19 0957  Resp 15 10/17/19 0957  SpO2 95 % 10/17/19 0957  Vitals shown include unvalidated device data.  Last Pain:  Vitals:   10/17/19 0700  TempSrc:   PainSc: 0-No pain      Patients Stated Pain Goal: 3 (17/51/02 5852)  Complications: No apparent anesthesia complications

## 2019-10-17 NOTE — Brief Op Note (Signed)
10/17/2019  9:56 AM  PATIENT:  Meredith Leeds  64 y.o. male  PRE-OPERATIVE DIAGNOSIS:  BILATERAL LUNG NODULES  POST-OPERATIVE DIAGNOSIS:  Bilateral Lung Nodules  PROCEDURE:  Procedure(s): VIDEO BRONCHOSCOPY WITH ENDOBRONCHIAL NAVIGATION (N/A) Needle aspirations, brushings and transbronchial biopsies  SURGEON:  Surgeon(s) and Role:    * Melrose Nakayama, MD - Primary  PHYSICIAN ASSISTANT:   ASSISTANTS: none   ANESTHESIA:   general  EBL:  minimal  BLOOD ADMINISTERED:none  DRAINS: none   LOCAL MEDICATIONS USED:  NONE  SPECIMEN:  Source of Specimen:  LUL and RUL nodules  DISPOSITION OF SPECIMEN:  PATHOLOGY  COUNTS:  NO endoscopic  TOURNIQUET:  * No tourniquets in log *  DICTATION: .Other Dictation: Dictation Number -  PLAN OF CARE: Discharge to home after PACU  PATIENT DISPOSITION:  PACU - hemodynamically stable.   Delay start of Pharmacological VTE agent (>24hrs) due to surgical blood loss or risk of bleeding: not applicable

## 2019-10-17 NOTE — Anesthesia Procedure Notes (Signed)
Procedure Name: Intubation Date/Time: 10/17/2019 8:04 AM Performed by: Orlie Dakin, CRNA Pre-anesthesia Checklist: Patient identified, Emergency Drugs available, Suction available and Patient being monitored Patient Re-evaluated:Patient Re-evaluated prior to induction Oxygen Delivery Method: Circle system utilized Preoxygenation: Pre-oxygenation with 100% oxygen Induction Type: IV induction Ventilation: Mask ventilation without difficulty and Oral airway inserted - appropriate to patient size Laryngoscope Size: Sabra Heck and 3 Grade View: Grade II Tube type: Oral Tube size: 9.0 mm Number of attempts: 1 Airway Equipment and Method: Stylet Placement Confirmation: positive ETCO2 and ETT inserted through vocal cords under direct vision Secured at: 24 cm Tube secured with: Tape Dental Injury: Teeth and Oropharynx as per pre-operative assessment  Comments: 4x4s bite block used at end of case.

## 2019-10-17 NOTE — Op Note (Signed)
NAME: Dale Thompson, Dale Thompson MEDICAL RECORD LK:[id number] ACCOUNT [id number] DATE OF BIRTH:[birth year] FACILITY: [facility] LOCATION: [facility] PHYSICIAN:Stefano Trulson Chaya Jan, MD  OPERATIVE REPORT  DATE OF PROCEDURE:  10/17/2019  PREOPERATIVE DIAGNOSIS:  Bilateral lung nodules.  POSTOPERATIVE DIAGNOSIS:  Bilateral lung nodules.  PROCEDURE:  Electromagnetic navigational bronchoscopy with needle aspirations, brushings, transbronchial biopsies, and bronchoalveolar lavage.  SURGEON:  Modesto Charon, MD  ASSISTANT:  None.  ANESTHESIA:  General.  FINDINGS:  Quick preps were nondiagnostic on both sides.  CLINICAL NOTE:  Dale Thompson is a 64 year old man with a remote history of tobacco abuse.  He had a high resolution CT to evaluate for ILD back in 2018.  There was no ILD, but he did have bilateral ground-glass opacities.  On repeat CT in December 2020  there was a reported increase in size of both the left upper lobe and right upper lobe nodules.  He was referred for consideration for surgical resection.  Due to bilateral disease, I recommended we do navigational bronchoscopy for biopsy prior to any type of  surgical intervention.  The indications, risks, benefits, and alternatives were discussed in detail with the patient.  He understood and accepted the risks and agreed to proceed.  DESCRIPTION OF PROCEDURE:  Dale Thompson was brought to the operating room on 10/17/2019.  He had induction of general anesthesia and was intubated.  Sequential compression devices were placed on the calves for DVT prophylaxis.  A Bair Hugger was placed to  maintain normothermia.  Planning for the procedure was done prior to induction.  A timeout was performed.  Flexible fiberoptic bronchoscopy was performed via the endotracheal tube.  There was normal endobronchial anatomy with no endobronchial lesions to the level of the subsegmental bronchi.   The locatable guide for navigation was placed and registration was   Performed. There was good correlation of the video and virtual bronchoscopy.  The bronchoscope was directed to the left upper lobe bronchus and the appropriate subsegmental bronchus was cannulated.  Locatable guide was advanced within 1.5 centimeter  of the left upper lobe nodule.  Local registration was performed.  Sampling then was performed.  Needle aspirations, brushings and transbronchial biopsies were all performed.  Fluoroscopy was used during all sampling.  The total fluoroscopy time was 7.5  minutes with a dose of 75 milligray.  The sampling then was repeated with local registration turned off.  In both cases, there was difficulty achieving good alignment.  There was good proximity within a centimeter and a half with the sheath placement, the catheter was aligned with the edge of the nodule, but never direct alignment with the center.  A similar process then was repeated on the right side.  In this case, there was good alignment with the center of the nodule and sampling was again performed both with and without local registration applied.  The same process of needle aspirations, brushings and  transbronchial biopsies was performed.  The initial samples were all nondiagnostic.  An attempt was made to recannulate the left upper lobe and achieve a better alignment, but because of the anatomy of the airways no direct alignment with the center of the nodule ever could be achieved.  Bronchoalveolar  lavage was performed and the specimen was sent for cytology.  There was some bleeding from the left upper lobe bronchus, which cleared with saline.  There was no ongoing bleeding at the end of the procedure.  The patient was extubated in the operating room and taken to the [facility] in  good condition.  CN/NUANCE  D:10/17/2019 T:10/17/2019 JOB:[id number]

## 2019-10-17 NOTE — Interval H&P Note (Signed)
History and Physical Interval Note:  10/17/2019 7:46 AM  Dale Thompson  has presented today for surgery, with the diagnosis of BILATERAL LUNG NODULES.  The various methods of treatment have been discussed with the patient and family. After consideration of risks, benefits and other options for treatment, the patient has consented to  Procedure(s): [place] (N/A) as a surgical intervention.  The patient's history has been reviewed, patient examined, no change in status, stable for surgery.  I have reviewed the patient's chart and labs.  Questions were answered to the patient's satisfaction.     Melrose Nakayama

## 2019-10-18 LAB — ACID FAST SMEAR (AFB, MYCOBACTERIA): Acid Fast Smear: NEGATIVE

## 2019-10-18 LAB — CYTOLOGY - NON PAP

## 2019-10-18 LAB — SURGICAL PATHOLOGY

## 2019-10-19 LAB — CULTURE, RESPIRATORY W GRAM STAIN
Culture: NO GROWTH
Gram Stain: NONE SEEN

## 2019-10-22 ENCOUNTER — Ambulatory Visit: Payer: Medicare HMO | Admitting: Thoracic Surgery (Cardiothoracic Vascular Surgery)

## 2019-10-22 ENCOUNTER — Other Ambulatory Visit: Payer: Self-pay

## 2019-10-22 VITALS — BP 136/80 | HR 78 | Temp 97.7°F | Resp 20 | Ht 67.0 in | Wt 175.7 lb

## 2019-10-22 DIAGNOSIS — R911 Solitary pulmonary nodule: Secondary | ICD-10-CM

## 2019-10-22 NOTE — Progress Notes (Signed)
Dale Thompson [address]       Dale Thompson,Dale Thompson [id number]             [phone]      HPI: Mr. Dale Thompson returns to discuss the results of his navigational bronchoscopy  Dale Thompson is a 64 year old man with a past history of hyperlipidemia, prostate cancer, asthma, gout, and Crohn's disease.  He also has a past history of tobacco abuse but quit in 1998.  He is followed by Dr. Melvyn Novas for wheezing.  He had a CT in 2018 which showed bilateral groundglass opacities.  A repeat CT in December 2020 showed possible increase in size although the films were not compared side-by-side.  We did a navigational bronchoscopy last week.  Quick preps in the OR were negative for malignant cells.  His final pathology showed inflammation and some scarring but no evidence of malignancy.  Past Medical History:  Diagnosis Date  . Asthma   . Cancer ([place])    prostate, basal cell skin cancer  . OSA (obstructive sleep apnea)   . Seasonal allergies     Current Outpatient Medications  Medication Sig Dispense Refill  . allopurinol (ZYLOPRIM) 100 MG tablet Take 200 mg by mouth daily.    Marland Kitchen atorvastatin (LIPITOR) 10 MG tablet Take 10 mg by mouth daily.      Marland Kitchen b complex vitamins tablet Take 1 tablet by mouth daily.    . budesonide (PULMICORT) 0.5 MG/2ML nebulizer solution Take 0.5 mg by nebulization daily. Rinse as directed twice daily  12  . budesonide-formoterol (SYMBICORT) 80-4.5 MCG/ACT inhaler Take 2 puffs first thing in am and then another 2 puffs about 12 hours later. 1 Inhaler 11  . Calcium Carbonate-Vitamin D (CALCIUM PLUS VITAMIN D PO) Take 1 tablet by mouth daily.    . fexofenadine (ALLEGRA) 180 MG tablet Take 180 mg by mouth daily.    . fluticasone (FLONASE) 50 MCG/ACT nasal spray Place 1 spray into both nostrils daily as needed for allergies or rhinitis.    . mometasone-formoterol (DULERA) 100-5 MCG/ACT AERO Inhale 2 puffs into the lungs 2 (two) times daily as needed for wheezing or shortness of  breath.    . pantoprazole (PROTONIX) 40 MG tablet Take 40 mg by mouth daily.     No current facility-administered medications for this visit.    Physical Exam BP 136/80 (BP Location: Right Arm, Patient Position: Sitting, Cuff Size: Normal)   Pulse 78   Temp 97.7 F (36.5 C) (Temporal)   Resp 20   Ht 5' 7"  (1.702 m)   Wt 175 lb 11.2 oz (79.7 kg)   SpO2 97% Comment: RA  BMI 27.52 kg/m  64 year old man in no acute distress Alert and oriented x3 with no focal deficits Well-developed and well-nourished  Diagnostic Tests: FINAL MICROSCOPIC DIAGNOSIS:   A. LUNG, LEFT UPPER LOBE, BIOPSY:  - Benign lung parenchyma with minimal inflammation and fibrosis.  - There is no evidence of malignancy.   B. LUNG, RIGHT UPPER LOBE, BIOPSY:  - Benign lung parenchyma with mild inflammation.  - There is no evidence of malignancy.   Impression: Dale Thompson is a 64 year old former smoker with bilateral lung nodules.  On navigational bronchoscopy these nodules both showed inflammation but no malignant cells were identified.  Had a long conversation with Mr. Dale Thompson and discussing the results.  He understands that a negative biopsy of this nature does not completely rule out the possibility of malignancy.  The only 2 ways to  completely rule out possibility are with excisional biopsy or radiographic follow-up to stability at 3 years.  Both of those approaches have advantages and disadvantages.  Given the relative nonaggressive appearance and central nature of the lesions which would require significant resections, I would favor radiographic follow-up.  His CT scan was done in December so he really is due for a follow-up scan now.  However, he will have post biopsy changes on the CT at this point, so I want a wait a few weeks for that to settle down before we rescan him.  Plan: CT chest in 1 month and return to discuss results.  Melrose Nakayama, MD [facility] [phone]

## 2019-10-23 ENCOUNTER — Other Ambulatory Visit: Payer: Self-pay | Admitting: *Deleted

## 2019-10-23 DIAGNOSIS — R918 Other nonspecific abnormal finding of lung field: Secondary | ICD-10-CM

## 2019-10-23 DIAGNOSIS — R911 Solitary pulmonary nodule: Secondary | ICD-10-CM

## 2019-10-28 ENCOUNTER — Encounter: Payer: Self-pay | Admitting: *Deleted

## 2019-11-15 LAB — FUNGUS CULTURE RESULT

## 2019-11-15 LAB — FUNGUS CULTURE WITH STAIN

## 2019-11-15 LAB — FUNGAL ORGANISM REFLEX

## 2019-11-20 ENCOUNTER — Ambulatory Visit
Admission: RE | Admit: 2019-11-20 | Discharge: 2019-11-20 | Disposition: A | Discharge status: Home / Self Care | Payer: Medicare HMO | Source: Ambulatory Visit | Attending: Thoracic Surgery (Cardiothoracic Vascular Surgery) | Admitting: Thoracic Surgery (Cardiothoracic Vascular Surgery)

## 2019-11-20 DIAGNOSIS — R918 Other nonspecific abnormal finding of lung field: Secondary | ICD-10-CM

## 2019-11-26 ENCOUNTER — Encounter: Payer: Self-pay | Admitting: Thoracic Surgery (Cardiothoracic Vascular Surgery)

## 2019-11-26 ENCOUNTER — Ambulatory Visit: Payer: Medicare HMO | Admitting: Thoracic Surgery (Cardiothoracic Vascular Surgery)

## 2019-11-26 ENCOUNTER — Other Ambulatory Visit: Payer: Self-pay

## 2019-11-26 VITALS — BP 135/85 | HR 80 | Temp 97.9°F | Resp 16 | Ht 67.0 in | Wt 175.0 lb

## 2019-11-26 DIAGNOSIS — R918 Other nonspecific abnormal finding of lung field: Secondary | ICD-10-CM

## 2019-11-26 DIAGNOSIS — Z9889 Other specified postprocedural states: Secondary | ICD-10-CM

## 2019-11-26 NOTE — Progress Notes (Signed)
[address]       [place], [address]             [phone]      HPI: Dale Thompson returns regarding bilateral lung nodules.  Dale Thompson is a 64 year old man with a history of hyperlipidemia, prostate cancer, asthma, gout, Crohn's disease, and remote history of tobacco abuse.  He quit smoking in 1998.  He is followed by Dr. Melvyn Novas for wheezing.  A CT in 2018 showed bilateral groundglass opacities.  A repeat CT in December 2020 showed a possible increase in size although the films were not compared side-by-side.  I did a navigational bronchoscopy about 6 weeks ago.  It showed inflammation and some scarring but no evidence of malignancy.  He now returns with a repeat CT which is 6 months from his previous scan.  Past Medical History:  Diagnosis Date  . Asthma   . Cancer ([place])    prostate, basal cell skin cancer  . OSA (obstructive sleep apnea)   . Seasonal allergies     Current Outpatient Medications  Medication Sig Dispense Refill  . allopurinol (ZYLOPRIM) 100 MG tablet Take 200 mg by mouth daily.    Marland Kitchen atorvastatin (LIPITOR) 10 MG tablet Take 10 mg by mouth daily.      Marland Kitchen b complex vitamins tablet Take 1 tablet by mouth daily.    . budesonide (PULMICORT) 0.5 MG/2ML nebulizer solution Take 0.5 mg by nebulization daily. Rinse as directed twice daily  12  . budesonide-formoterol (SYMBICORT) 80-4.5 MCG/ACT inhaler Take 2 puffs first thing in am and then another 2 puffs about 12 hours later. 1 Inhaler 11  . Calcium Carbonate-Vitamin D (CALCIUM PLUS VITAMIN D PO) Take 1 tablet by mouth daily.    . fexofenadine (ALLEGRA) 180 MG tablet Take 180 mg by mouth daily.    . fluticasone (FLONASE) 50 MCG/ACT nasal spray Place 1 spray into both nostrils daily as needed for allergies or rhinitis.    . mometasone-formoterol (DULERA) 100-5 MCG/ACT AERO Inhale 2 puffs into the lungs 2 (two) times daily as needed for wheezing or shortness of breath.    . pantoprazole (PROTONIX) 40  MG tablet Take 40 mg by mouth daily.     No current facility-administered medications for this visit.    Physical Exam BP 135/85 (BP Location: Right Arm, Patient Position: Sitting, Cuff Size: Normal)   Pulse 80   Temp 97.9 F (36.6 C)   Resp 16   Ht 5' 7"  (1.702 m)   Wt 175 lb (79.4 kg)   SpO2 94% Comment: RA  BMI 27.18 kg/m  64 year old man in no acute distress Alert and oriented x3 with no focal deficits  Diagnostic Tests: CT CHEST WITHOUT CONTRAST  TECHNIQUE: Multidetector CT imaging of the chest was performed following the standard protocol without IV contrast.  COMPARISON:  Chest CT 07/16/2019  FINDINGS: Cardiovascular: The heart is normal in size. No pericardial effusion. Moderate age advanced atherosclerotic calcifications involving the thoracic aorta but no focal aneurysm. 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 Moderate atherosclerotic calcification at the origin of the left subclavian artery with moderate grade stenosis suspected.  Mediastinum/Nodes: Stable scattered mediastinal and hilar lymph nodes. No mass or overt adenopathy. The esophagus is grossly normal. The thyroid gland is unremarkable.  Lungs/Pleura: Stable ill-defined sub solid nodularity in the right upper lobe on image 70/8. This measures approximately 13 mm and measured 12 mm on the prior study.  Left upper  lobe ground-glass nodular density on image 35/8 measures approximately 15 mm and is stable.  Ill-defined patchy density in the right lower lobe on image 67/8 is likely scarring change.  Bibasilar scarring changes appears stable.  No new pulmonary lesions or acute pulmonary infiltrates. No pneumothorax or pleural effusion. Stable underlying emphysematous changes.  Upper Abdomen: No significant upper abdominal findings. Suspect cirrhotic changes involving the liver but no hepatic lesions.  Musculoskeletal: No chest wall mass, supraclavicular or  axillary adenopathy. The bony thorax is intact.  IMPRESSION: 1. Stable sub solid nodularity in the right upper lobe and left upper lobe. Recommend continued surveillance. 2. Stable underlying emphysematous changes and pulmonary scarring. 3. No new pulmonary lesions or acute pulmonary infiltrates. 4. Stable scattered mediastinal and hilar lymph nodes but no mass or adenopathy. 5. Aberrant right subclavian artery and moderate atherosclerotic calcification at the origin of the left subclavian artery with moderate grade stenosis suspected. 6. Emphysema and aortic atherosclerosis.  Aortic Atherosclerosis (ICD10-I70.0) and Emphysema (ICD10-J43.9).  Aortic Atherosclerosis (ICD10-I70.0) and Emphysema (ICD10-J43.9).   Electronically Signed   By: Marijo Sanes M.D.   On: 11/20/2019 10:23 I personally reviewed the CT images and concur with the findings noted above  Impression: Dale Thompson is a 64 year old man with a history of hyperlipidemia, prostate cancer, asthma, gout, Crohn's disease, and remote history of tobacco abuse.   Lung nodules-first found to have bilateral lung nodules in 2018.  By report may have increased in size between them in December 2020.  Navigational bronchoscopy showed inflammation but no malignancy.  Both these nodules could represent inflammatory or scarring process.  They also both could be low-grade adenocarcinomas.  They warrant continued follow-up.  I recommended returning with a repeat CT in 6 months.  Aberrant right subclavian-I reviewed the films with him.  He does not have any symptoms related to that.  Thoracic aortic and subclavian atherosclerosis-he is on a statin and quit smoking years ago.  Plan: Return in 6 months with CT chest  Melrose Nakayama, MD [facility] [phone]

## 2019-12-02 LAB — ACID FAST CULTURE WITH REFLEXED SENSITIVITIES (MYCOBACTERIA): Acid Fast Culture: NEGATIVE

## 2019-12-09 DIAGNOSIS — M810 Age-related osteoporosis without current pathological fracture: Secondary | ICD-10-CM | POA: Diagnosis not present

## 2019-12-19 DIAGNOSIS — M810 Age-related osteoporosis without current pathological fracture: Secondary | ICD-10-CM | POA: Diagnosis not present

## 2020-02-05 DIAGNOSIS — R69 Illness, unspecified: Secondary | ICD-10-CM | POA: Diagnosis not present

## 2020-02-22 DIAGNOSIS — Z20822 Contact with and (suspected) exposure to covid-19: Secondary | ICD-10-CM | POA: Diagnosis not present

## 2020-03-30 DIAGNOSIS — C61 Malignant neoplasm of prostate: Secondary | ICD-10-CM | POA: Diagnosis not present

## 2020-04-02 DIAGNOSIS — R7989 Other specified abnormal findings of blood chemistry: Secondary | ICD-10-CM | POA: Diagnosis not present

## 2020-04-02 DIAGNOSIS — K219 Gastro-esophageal reflux disease without esophagitis: Secondary | ICD-10-CM | POA: Diagnosis not present

## 2020-04-02 DIAGNOSIS — E785 Hyperlipidemia, unspecified: Secondary | ICD-10-CM | POA: Diagnosis not present

## 2020-04-02 DIAGNOSIS — Z79899 Other long term (current) drug therapy: Secondary | ICD-10-CM | POA: Diagnosis not present

## 2020-04-02 DIAGNOSIS — M1990 Unspecified osteoarthritis, unspecified site: Secondary | ICD-10-CM | POA: Diagnosis not present

## 2020-04-02 DIAGNOSIS — M109 Gout, unspecified: Secondary | ICD-10-CM | POA: Diagnosis not present

## 2020-04-02 DIAGNOSIS — K5090 Crohn's disease, unspecified, without complications: Secondary | ICD-10-CM | POA: Diagnosis not present

## 2020-04-16 DIAGNOSIS — R69 Illness, unspecified: Secondary | ICD-10-CM | POA: Diagnosis not present

## 2020-05-04 ENCOUNTER — Other Ambulatory Visit: Payer: Self-pay | Admitting: Thoracic Surgery (Cardiothoracic Vascular Surgery)

## 2020-05-04 DIAGNOSIS — R911 Solitary pulmonary nodule: Secondary | ICD-10-CM

## 2020-05-04 NOTE — Progress Notes (Unsigned)
Ct

## 2020-05-18 DIAGNOSIS — L2089 Other atopic dermatitis: Secondary | ICD-10-CM | POA: Diagnosis not present

## 2020-05-18 DIAGNOSIS — L718 Other rosacea: Secondary | ICD-10-CM | POA: Diagnosis not present

## 2020-05-18 DIAGNOSIS — D2262 Melanocytic nevi of left upper limb, including shoulder: Secondary | ICD-10-CM | POA: Diagnosis not present

## 2020-05-18 DIAGNOSIS — L814 Other melanin hyperpigmentation: Secondary | ICD-10-CM | POA: Diagnosis not present

## 2020-05-18 DIAGNOSIS — L821 Other seborrheic keratosis: Secondary | ICD-10-CM | POA: Diagnosis not present

## 2020-05-18 DIAGNOSIS — Z85828 Personal history of other malignant neoplasm of skin: Secondary | ICD-10-CM | POA: Diagnosis not present

## 2020-05-18 DIAGNOSIS — L218 Other seborrheic dermatitis: Secondary | ICD-10-CM | POA: Diagnosis not present

## 2020-05-18 DIAGNOSIS — D485 Neoplasm of uncertain behavior of skin: Secondary | ICD-10-CM | POA: Diagnosis not present

## 2020-05-18 DIAGNOSIS — C44519 Basal cell carcinoma of skin of other part of trunk: Secondary | ICD-10-CM | POA: Diagnosis not present

## 2020-05-18 DIAGNOSIS — D2261 Melanocytic nevi of right upper limb, including shoulder: Secondary | ICD-10-CM | POA: Diagnosis not present

## 2020-05-18 DIAGNOSIS — D225 Melanocytic nevi of trunk: Secondary | ICD-10-CM | POA: Diagnosis not present

## 2020-06-01 DIAGNOSIS — R7989 Other specified abnormal findings of blood chemistry: Secondary | ICD-10-CM | POA: Diagnosis not present

## 2020-06-02 ENCOUNTER — Other Ambulatory Visit: Payer: Self-pay

## 2020-06-02 ENCOUNTER — Ambulatory Visit
Admission: RE | Admit: 2020-06-02 | Discharge: 2020-06-02 | Disposition: A | Discharge status: Home / Self Care | Payer: Medicare HMO | Source: Ambulatory Visit | Attending: Thoracic Surgery (Cardiothoracic Vascular Surgery) | Admitting: Thoracic Surgery (Cardiothoracic Vascular Surgery)

## 2020-06-02 DIAGNOSIS — Q278 Other specified congenital malformations of peripheral vascular system: Secondary | ICD-10-CM | POA: Diagnosis not present

## 2020-06-02 DIAGNOSIS — I2510 Atherosclerotic heart disease of native coronary artery without angina pectoris: Secondary | ICD-10-CM | POA: Diagnosis not present

## 2020-06-02 DIAGNOSIS — R911 Solitary pulmonary nodule: Secondary | ICD-10-CM

## 2020-06-02 DIAGNOSIS — J479 Bronchiectasis, uncomplicated: Secondary | ICD-10-CM | POA: Diagnosis not present

## 2020-06-02 DIAGNOSIS — Z8546 Personal history of malignant neoplasm of prostate: Secondary | ICD-10-CM | POA: Diagnosis not present

## 2020-06-09 ENCOUNTER — Ambulatory Visit: Payer: Medicare HMO | Admitting: Thoracic Surgery (Cardiothoracic Vascular Surgery)

## 2020-06-09 ENCOUNTER — Encounter: Payer: Self-pay | Admitting: Thoracic Surgery (Cardiothoracic Vascular Surgery)

## 2020-06-09 ENCOUNTER — Other Ambulatory Visit: Payer: Self-pay

## 2020-06-09 VITALS — BP 122/77 | HR 77 | Resp 18 | Ht 67.0 in | Wt 169.0 lb

## 2020-06-09 DIAGNOSIS — R911 Solitary pulmonary nodule: Secondary | ICD-10-CM | POA: Diagnosis not present

## 2020-06-09 NOTE — Progress Notes (Signed)
[address]       [place] [address]             [phone]      HPI: Dale Thompson returns for a scheduled follow-up visit  Dale Thompson is a 64 year old man with a history of hyperlipidemia, prostate cancer, asthma, gout, Crohn's disease, remote tobacco abuse, aberrant right subclavian artery, aortic atherosclerosis, and lung nodules.  He has a 22-pack-year history of smoking having quit in 1998.  He was found to have bilateral groundglass opacities in 2018.  On a CT in December 2020 the left upper lobe nodule measured larger.  There also was a mixed density nodule in the right upper lobe that may have been slightly larger as well.  I did a navigational bronchoscopy in April 2021.  Biopsy showed inflammation and scarring.  There was no evidence of malignancy.  We elected to follow the nodules radiographically.  Since I saw him in May he has been doing well.  He has not had any medical issues.  He denies any chest pain or shortness of breath.  Past Medical History:  Diagnosis Date  . Asthma   . Cancer ([place])    prostate, basal cell skin cancer  . OSA (obstructive sleep apnea)   . Seasonal allergies     Current Outpatient Medications  Medication Sig Dispense Refill  . allopurinol (ZYLOPRIM) 100 MG tablet Take 200 mg by mouth daily.    Marland Kitchen atorvastatin (LIPITOR) 10 MG tablet Take 10 mg by mouth daily.      Marland Kitchen b complex vitamins tablet Take 1 tablet by mouth daily.    . budesonide (PULMICORT) 0.5 MG/2ML nebulizer solution Take 0.5 mg by nebulization daily. Rinse as directed twice daily  12  . budesonide-formoterol (SYMBICORT) 80-4.5 MCG/ACT inhaler Take 2 puffs first thing in am and then another 2 puffs about 12 hours later. 1 Inhaler 11  . Calcium Carbonate-Vitamin D (CALCIUM PLUS VITAMIN D PO) Take 1 tablet by mouth daily.    . fexofenadine (ALLEGRA) 180 MG tablet Take 180 mg by mouth daily.    . fluticasone (FLONASE) 50 MCG/ACT nasal spray Place 1 spray into  both nostrils daily as needed for allergies or rhinitis.    . mometasone-formoterol (DULERA) 100-5 MCG/ACT AERO Inhale 2 puffs into the lungs 2 (two) times daily as needed for wheezing or shortness of breath.    . pantoprazole (PROTONIX) 40 MG tablet Take 40 mg by mouth daily.     No current facility-administered medications for this visit.    Physical Exam BP 122/77 (BP Location: Left Arm, Patient Position: Sitting)   Pulse 77   Resp 18   Ht 5' 7"  (1.702 m)   Wt 169 lb (76.7 kg)   SpO2 96% Comment: RA with mask on  BMI 26.59 kg/m  64 year old man in no acute distress Alert and oriented x3 with no focal deficits Cardiac regular rate and rhythm normal S1 and S2 Lungs clear bilaterally  Diagnostic Tests: CT CHEST WITHOUT CONTRAST  TECHNIQUE: Multidetector CT imaging of the chest was performed following the standard protocol without IV contrast.  COMPARISON:  Nov 20, 2019  FINDINGS: Cardiovascular: Aberrant RIGHT subclavian artery. Calcified atheromatous plaque of the thoracic aorta. No aneurysmal dilation. Two vessel coronary artery calcification of LEFT coronary circulation. Heart size is normal without pericardial effusion. Central pulmonary vasculature is of normal caliber. Limited assessment of cardiovascular structures given lack of intravenous contrast.  Mediastinum/Nodes: Esophagus is normal.  No axillary, thoracic inlet or mediastinal lymphadenopathy. No gross hilar adenopathy.  Lungs/Pleura: Subsolid nodule in the LEFT upper lobe measuring approximately 16 x 14 mm is similar to the prior exam. Margins are indistinct hampering measurement. No discrete solid component is noted. Finding is of similar size when compared to the study of July 16, 2019. Airways are patent. Basilar scarring on the LEFT in the lower lobe is similar to the prior study.  Spiculated nodular density along the minor fissure in the RIGHT chest (image 74, series 5) 8 x 6 mm. As much  is 7 x 8 mm on image 74 of series 5.  Mildly dilated bronchial structures in the superior segment of the RIGHT lower lobe (image 73 of series 5) with some inspissated secretions in peripheral bronchial at this level, finding is similar to the previous study.  Upper Abdomen: Incidental imaging of upper abdominal contents without acute process.  Musculoskeletal: No acute bone finding. No destructive bone finding.  IMPRESSION: 1. Subsolid nodule in the LEFT upper lobe measuring approximately 16 x 14 mm is similar to the prior exam. No discrete solid component is noted. Continued surveillance is suggested. 2. Reportedly there is prior imaging from 2018, if this could be made available comparison study with this evaluation could obviate the need for further follow-up. 3. Spiculated nodular density along the minor fissure in the RIGHT chest 8 x 6 mm. This may represent an area of scarring, attention on follow-up. CT imaging at 6-12 months from today's scan may be helpful to complete follow-up for this area which has different characteristics from the area in the LEFT upper lobe. If this area is stable could consider extending follow-up interval to 24 months for future follow-up of these findings. 4. Mildly dilated bronchial structures in the superior segment of the RIGHT lower lobe with some inspissated secretions in peripheral bronchial at this level, finding is similar to the previous study. 5. Two vessel coronary artery calcification of LEFT coronary circulation. 6. Aberrant RIGHT subclavian artery. 7. Aortic atherosclerosis.  Aortic Atherosclerosis (ICD10-I70.0).   Electronically Signed   By: Zetta Bills M.D.   On: 06/02/2020 12:40 I personally reviewed the CT images and concur with the findings noted above  Impression: Dale Thompson is a 64 year old man with a history of hyperlipidemia, prostate cancer, asthma, gout, Crohn's disease, remote tobacco abuse,  aberrant right subclavian artery, aortic atherosclerosis, and lung nodules.   Lung nodules-groundglass and mixed density nodules.  Although measured differently on today's CT the right upper lobe nodule is unchanged.  The left upper lobe nodule remains unchanged.  We will recheck again in 6 months.  Aortic atherosclerosis-also has extensive atherosclerotic changes particularly in the left subclavian artery, but also the aberrant right subclavian.  Asymptomatic with no symptoms of left subclavian steal.  On Lipitor.  Aberrant right subclavian artery-does wraparound posteriorly between the spine and the esophagus.  He says occasionally he will have some difficulty swallowing but it has not been a major issue for him, this is something to keep in mind if that were to worsen.  Plan: Return in 6 months with CT chest  Melrose Nakayama, MD [facility] [phone]

## 2020-06-16 DIAGNOSIS — R69 Illness, unspecified: Secondary | ICD-10-CM | POA: Diagnosis not present

## 2020-06-23 DIAGNOSIS — R69 Illness, unspecified: Secondary | ICD-10-CM | POA: Diagnosis not present

## 2020-06-24 DIAGNOSIS — M810 Age-related osteoporosis without current pathological fracture: Secondary | ICD-10-CM | POA: Diagnosis not present

## 2020-07-08 DIAGNOSIS — Z20822 Contact with and (suspected) exposure to covid-19: Secondary | ICD-10-CM | POA: Diagnosis not present

## 2020-07-20 DIAGNOSIS — E789 Disorder of lipoprotein metabolism, unspecified: Secondary | ICD-10-CM | POA: Diagnosis not present

## 2020-07-20 DIAGNOSIS — Z0000 Encounter for general adult medical examination without abnormal findings: Secondary | ICD-10-CM | POA: Diagnosis not present

## 2020-07-20 DIAGNOSIS — Z125 Encounter for screening for malignant neoplasm of prostate: Secondary | ICD-10-CM | POA: Diagnosis not present

## 2020-07-24 DIAGNOSIS — M1A9XX0 Chronic gout, unspecified, without tophus (tophi): Secondary | ICD-10-CM | POA: Diagnosis not present

## 2020-07-24 DIAGNOSIS — E789 Disorder of lipoprotein metabolism, unspecified: Secondary | ICD-10-CM | POA: Diagnosis not present

## 2020-07-24 DIAGNOSIS — I2510 Atherosclerotic heart disease of native coronary artery without angina pectoris: Secondary | ICD-10-CM | POA: Diagnosis not present

## 2020-07-24 DIAGNOSIS — J4520 Mild intermittent asthma, uncomplicated: Secondary | ICD-10-CM | POA: Diagnosis not present

## 2020-07-24 DIAGNOSIS — R911 Solitary pulmonary nodule: Secondary | ICD-10-CM | POA: Diagnosis not present

## 2020-07-24 DIAGNOSIS — M810 Age-related osteoporosis without current pathological fracture: Secondary | ICD-10-CM | POA: Diagnosis not present

## 2020-07-24 DIAGNOSIS — J302 Other seasonal allergic rhinitis: Secondary | ICD-10-CM | POA: Diagnosis not present

## 2020-07-24 DIAGNOSIS — Z0001 Encounter for general adult medical examination with abnormal findings: Secondary | ICD-10-CM | POA: Diagnosis not present

## 2020-07-24 DIAGNOSIS — K219 Gastro-esophageal reflux disease without esophagitis: Secondary | ICD-10-CM | POA: Diagnosis not present

## 2020-07-24 DIAGNOSIS — I700 Atherosclerosis of aorta: Secondary | ICD-10-CM | POA: Diagnosis not present

## 2020-07-24 DIAGNOSIS — G4733 Obstructive sleep apnea (adult) (pediatric): Secondary | ICD-10-CM | POA: Diagnosis not present

## 2020-08-28 DIAGNOSIS — Z6827 Body mass index (BMI) 27.0-27.9, adult: Secondary | ICD-10-CM | POA: Diagnosis not present

## 2020-08-28 DIAGNOSIS — J324 Chronic pansinusitis: Secondary | ICD-10-CM | POA: Diagnosis not present

## 2020-09-09 ENCOUNTER — Encounter: Payer: Self-pay | Admitting: Cardiovascular Disease

## 2020-09-09 ENCOUNTER — Ambulatory Visit: Payer: Medicare HMO | Admitting: Cardiovascular Disease

## 2020-09-09 ENCOUNTER — Other Ambulatory Visit: Payer: Self-pay

## 2020-09-09 DIAGNOSIS — E782 Mixed hyperlipidemia: Secondary | ICD-10-CM | POA: Diagnosis not present

## 2020-09-09 DIAGNOSIS — E785 Hyperlipidemia, unspecified: Secondary | ICD-10-CM | POA: Insufficient documentation

## 2020-09-09 DIAGNOSIS — I2510 Atherosclerotic heart disease of native coronary artery without angina pectoris: Secondary | ICD-10-CM | POA: Insufficient documentation

## 2020-09-09 NOTE — Progress Notes (Signed)
09/09/2020 Dale Thompson   June 29, 1956  [id number]  Primary Physician Deland Pretty, MD Primary Cardiologist: Lorretta Harp MD Lupe Carney, [place]  HPI:  Dale Thompson is a 65 y.o. thin and fit appearing married Caucasian male father 36, grandfather 2 grandchildren who is retired from working at [employer] for 36 years.  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  He also has a Perry.  He was referred by Dr. Shelia Media, his primary care physician, for evaluation of coronary calcification seen on screening chest CT.  His risk factors include treated hyperlipidemia currently on rosuvastatin.  There is no family history for heart disease.  Never had a heart tach or stroke.  Has had remote Crohn's disease back in 36s which has been quiesced sent and low grade prostate cancer which is being carefully surveyed.  He works out on the elliptical for an hour a day.  He did get evaluated at [facility] cardiology in 2015 with a negative GXT and 2D echo.   Current Meds  Medication Sig  . allopurinol (ZYLOPRIM) 100 MG tablet Take 200 mg by mouth daily.  Marland Kitchen aspirin 81 MG chewable tablet 1 tablet  . b complex vitamins tablet Take 1 tablet by mouth daily.  . budesonide (PULMICORT) 0.5 MG/2ML nebulizer solution Take 0.5 mg by nebulization daily. Rinse as directed twice daily  . budesonide-formoterol (SYMBICORT) 80-4.5 MCG/ACT inhaler Take 2 puffs first thing in am and then another 2 puffs about 12 hours later.  . Calcium Carbonate-Vitamin D (CALCIUM PLUS VITAMIN D PO) Take 1 tablet by mouth daily.  . Colchicine 0.6 MG CAPS 1 capsule  . denosumab (PROLIA) 60 MG/ML SOSY injection See admin instructions.  . fexofenadine (ALLEGRA) 180 MG tablet Take 180 mg by mouth daily.  . fluticasone (FLONASE) 50 MCG/ACT nasal spray Place 1 spray into both nostrils daily as needed for allergies or rhinitis.  . mometasone-formoterol (DULERA) 100-5 MCG/ACT AERO Inhale  2 puffs into the lungs 2 (two) times daily as needed for wheezing or shortness of breath.  . pantoprazole (PROTONIX) 40 MG tablet Take 40 mg by mouth daily.  . rosuvastatin (CRESTOR) 20 MG tablet Take 20 mg by mouth at bedtime.  . [DISCONTINUED] atorvastatin (LIPITOR) 10 MG tablet Take 10 mg by mouth daily.     No Known Allergies  Social History   Socioeconomic History  . Marital status: Married    Spouse name: Not on file  . Number of children: 2  . Years of education: Not on file  . Highest education level: Not on file  Occupational History  . Occupation: Programme researcher, broadcasting/film/video  Tobacco Use  . Smoking status: Former Thompson    Packs/day: 1.00    Years: 22.00    Pack years: 22.00    Types: Cigarettes    Quit date: 05/18/1997    Years since quitting: 23.3  . Smokeless tobacco: Never Used  Vaping Use  . Vaping Use: Never used  Substance and Sexual Activity  . Alcohol use: Yes    Comment: 12 beers per wk  . Drug use: No  . Sexual activity: Not on file  Other Topics Concern  . Not on file  Social History Narrative  . Not on file   Social Determinants of Health   Financial Resource Strain: Not on file  Food Insecurity: Not on file  Transportation Needs: Not on file  Physical Activity: Not on file  Stress: Not on file  Social Connections: Not on file  Intimate Partner Violence: Not on file     Review of Systems: General: negative for chills, fever, night sweats or weight changes.  Cardiovascular: negative for chest pain, dyspnea on exertion, edema, orthopnea, palpitations, paroxysmal nocturnal dyspnea or shortness of breath Dermatological: negative for rash Respiratory: negative for cough or wheezing Urologic: negative for hematuria Abdominal: negative for nausea, vomiting, diarrhea, bright red blood per rectum, melena, or hematemesis Neurologic: negative for visual changes, syncope, or dizziness All other systems reviewed and are otherwise negative except as noted  above.    Blood pressure 124/68, pulse 71, height 5' 7"  (1.702 m), weight 175 lb (79.4 kg), SpO2 97 %.  General appearance: alert and no distress Neck: no adenopathy, no carotid bruit, no JVD, supple, symmetrical, trachea midline and thyroid not enlarged, symmetric, no tenderness/mass/nodules Lungs: clear to auscultation bilaterally Heart: regular rate and rhythm, S1, S2 normal, no murmur, click, rub or gallop Extremities: extremities normal, atraumatic, no cyanosis or edema Pulses: 2+ and symmetric Skin: Skin color, texture, turgor normal. No rashes or lesions Neurologic: Alert and oriented X 3, normal strength and tone. Normal symmetric reflexes. Normal coordination and gait  EKG sinus rhythm at 71 without ST or T wave changes.  Personally reviewed this EKG.  ASSESSMENT AND PLAN:   Hyperlipidemia Patient hyperlipidemia on Lipitor until recently and change to Crestor 20 mg a day.  His most recent lipid profile performed 07/20/2020 revealed LDL of 82.  Coronary artery calcification seen on CT scan History of coronary calcification seen on recent surveillance chest CT 08/03/2019 in the left coronary system.  He is completely asymptomatic.  He exercises an hour a day.  And to get a coronary calcium score to further evaluate.      Lorretta Harp MD FACP,FACC,FAHA, [facility] 09/09/2020 4:28 PM

## 2020-09-09 NOTE — Assessment & Plan Note (Signed)
History of coronary calcification seen on recent surveillance chest CT 08/03/2019 in the left coronary system.  He is completely asymptomatic.  He exercises an hour a day.  And to get a coronary calcium score to further evaluate.

## 2020-09-09 NOTE — Assessment & Plan Note (Signed)
Patient hyperlipidemia on Lipitor until recently and change to Crestor 20 mg a day.  His most recent lipid profile performed 07/20/2020 revealed LDL of 82.

## 2020-09-09 NOTE — Patient Instructions (Signed)
Medication Instructions:  Your physician recommends that you continue on your current medications as directed. Please refer to the Current Medication list given to you today.  *If you need a refill on your cardiac medications before your next appointment, please call your pharmacy*   Testing/Procedures: Dr. Gwenlyn Found has ordered a CT coronary calcium score. This test is done at [address]. This is $99 out of pocket.   Coronary CalciumScan A coronary calcium scan is an imaging test used to look for deposits of calcium and other fatty materials (plaques) in the inner lining of the blood vessels of the heart (coronary arteries). These deposits of calcium and plaques can partly clog and narrow the coronary arteries without producing any symptoms or warning signs. This puts a person at risk for a heart attack. This test can detect these deposits before symptoms develop. Tell a health care provider about:  Any allergies you have.  All medicines you are taking, including vitamins, herbs, eye drops, creams, and over-the-counter medicines.  Any problems you or family members have had with anesthetic medicines.  Any blood disorders you have.  Any surgeries you have had.  Any medical conditions you have.  Whether you are pregnant or may be pregnant. What are the risks? Generally, this is a safe procedure. However, problems may occur, including:  Harm to a pregnant woman and her unborn baby. This test involves the use of radiation. Radiation exposure can be dangerous to a pregnant woman and her unborn baby. If you are pregnant, you generally should not have this procedure done.  Slight increase in the risk of cancer. This is because of the radiation involved in the test. What happens before the procedure? No preparation is needed for this procedure. What happens during the procedure?  You will undress and remove any jewelry around your neck or chest.  You will put on a  hospital gown.  Sticky electrodes will be placed on your chest. The electrodes will be connected to an electrocardiogram (ECG) machine to record a tracing of the electrical activity of your heart.  A CT scanner will take pictures of your heart. During this time, you will be asked to lie still and hold your breath for 2-3 seconds while a picture of your heart is being taken. The procedure may vary among health care providers and hospitals. What happens after the procedure?  You can get dressed.  You can return to your normal activities.  It is up to you to get the results of your test. Ask your health care provider, or the department that is doing the test, when your results will be ready. Summary  A coronary calcium scan is an imaging test used to look for deposits of calcium and other fatty materials (plaques) in the inner lining of the blood vessels of the heart (coronary arteries).  Generally, this is a safe procedure. Tell your health care provider if you are pregnant or may be pregnant.  No preparation is needed for this procedure.  A CT scanner will take pictures of your heart.  You can return to your normal activities after the scan is done. This information is not intended to replace advice given to you by your health care provider. Make sure you discuss any questions you have with your health care provider. Document Released: 12/31/2007 Document Revised: 05/23/2016 Document Reviewed: 05/23/2016 Elsevier Interactive Patient Education  2017 [employer].    Follow-Up: At [facility], you and your health needs are  our priority.  As part of our continuing mission to provide you with exceptional heart care, we have created designated Provider Care Teams.  These Care Teams include your primary Cardiologist (physician) and Advanced Practice Providers (APPs -  Physician Assistants and Nurse Practitioners) who all work together to provide you with the care you need, when you need  it.  We recommend signing up for the patient portal called "MyChart".  Sign up information is provided on this After Visit Summary.  MyChart is used to connect with patients for Virtual Visits (Telemedicine).  Patients are able to view lab/test results, encounter notes, upcoming appointments, etc.  Non-urgent messages can be sent to your provider as well.   To learn more about what you can do with MyChart, go to [email or web address].    Your next appointment:   12 month(s)  The format for your next appointment:   In Person  Provider:   Quay Burow, MD

## 2020-09-18 DIAGNOSIS — C61 Malignant neoplasm of prostate: Secondary | ICD-10-CM | POA: Diagnosis not present

## 2020-09-22 DIAGNOSIS — H35361 Drusen (degenerative) of macula, right eye: Secondary | ICD-10-CM | POA: Diagnosis not present

## 2020-09-22 DIAGNOSIS — H40023 Open angle with borderline findings, high risk, bilateral: Secondary | ICD-10-CM | POA: Diagnosis not present

## 2020-09-22 DIAGNOSIS — H2513 Age-related nuclear cataract, bilateral: Secondary | ICD-10-CM | POA: Diagnosis not present

## 2020-09-22 DIAGNOSIS — H25013 Cortical age-related cataract, bilateral: Secondary | ICD-10-CM | POA: Diagnosis not present

## 2020-09-24 DIAGNOSIS — C61 Malignant neoplasm of prostate: Secondary | ICD-10-CM | POA: Diagnosis not present

## 2020-09-24 DIAGNOSIS — N5201 Erectile dysfunction due to arterial insufficiency: Secondary | ICD-10-CM | POA: Diagnosis not present

## 2020-09-25 ENCOUNTER — Other Ambulatory Visit: Payer: Self-pay | Admitting: Urology

## 2020-09-25 DIAGNOSIS — C61 Malignant neoplasm of prostate: Secondary | ICD-10-CM

## 2020-10-02 ENCOUNTER — Ambulatory Visit (INDEPENDENT_AMBULATORY_CARE_PROVIDER_SITE_OTHER)
Admission: RE | Admit: 2020-10-02 | Discharge: 2020-10-02 | Disposition: A | Discharge status: Home / Self Care | Payer: Self-pay | Source: Ambulatory Visit | Attending: Cardiovascular Disease | Admitting: Cardiovascular Disease

## 2020-10-02 ENCOUNTER — Other Ambulatory Visit: Payer: Self-pay

## 2020-10-02 DIAGNOSIS — I2510 Atherosclerotic heart disease of native coronary artery without angina pectoris: Secondary | ICD-10-CM

## 2020-10-02 DIAGNOSIS — E782 Mixed hyperlipidemia: Secondary | ICD-10-CM

## 2020-10-09 ENCOUNTER — Telehealth: Payer: Self-pay

## 2020-10-09 NOTE — Telephone Encounter (Signed)
Left message for pt to call back regarding coronary calcium score.

## 2020-10-09 NOTE — Telephone Encounter (Signed)
Pt is returning call.  

## 2020-10-09 NOTE — Telephone Encounter (Signed)
-----   Message from Lorretta Harp, MD sent at 10/04/2020 12:52 PM EDT ----- Cor CA score of 552, elevated. LDL 82 on Crestor 10 mg. Recently increased to 20 mg. Goal is LDL<70.

## 2020-10-09 NOTE — Telephone Encounter (Signed)
Spoke with pt, .aware of results.

## 2020-10-18 ENCOUNTER — Ambulatory Visit
Admission: RE | Admit: 2020-10-18 | Discharge: 2020-10-18 | Disposition: A | Discharge status: Home / Self Care | Payer: Medicare HMO | Source: Ambulatory Visit | Attending: Urology | Admitting: Urology

## 2020-10-18 ENCOUNTER — Other Ambulatory Visit: Payer: Self-pay

## 2020-10-18 DIAGNOSIS — C61 Malignant neoplasm of prostate: Secondary | ICD-10-CM | POA: Diagnosis not present

## 2020-10-18 MED ORDER — GADOBENATE DIMEGLUMINE 529 MG/ML IV SOLN
16.0000 mL | Freq: Once | INTRAVENOUS | Status: AC | PRN
  Administered 2020-10-18: 16 mL via INTRAVENOUS

## 2020-10-21 ENCOUNTER — Other Ambulatory Visit: Payer: Self-pay | Admitting: Thoracic Surgery (Cardiothoracic Vascular Surgery)

## 2020-10-21 DIAGNOSIS — R918 Other nonspecific abnormal finding of lung field: Secondary | ICD-10-CM

## 2020-12-01 DIAGNOSIS — H40023 Open angle with borderline findings, high risk, bilateral: Secondary | ICD-10-CM | POA: Diagnosis not present

## 2020-12-01 DIAGNOSIS — H524 Presbyopia: Secondary | ICD-10-CM | POA: Diagnosis not present

## 2020-12-11 ENCOUNTER — Other Ambulatory Visit: Payer: Medicare HMO

## 2020-12-15 ENCOUNTER — Ambulatory Visit: Payer: Medicare HMO | Admitting: Thoracic Surgery (Cardiothoracic Vascular Surgery)

## 2020-12-21 ENCOUNTER — Other Ambulatory Visit: Payer: Self-pay

## 2020-12-21 ENCOUNTER — Ambulatory Visit
Admission: RE | Admit: 2020-12-21 | Discharge: 2020-12-21 | Disposition: A | Discharge status: Home / Self Care | Payer: Medicare HMO | Source: Ambulatory Visit | Attending: Thoracic Surgery (Cardiothoracic Vascular Surgery) | Admitting: Thoracic Surgery (Cardiothoracic Vascular Surgery)

## 2020-12-21 DIAGNOSIS — R918 Other nonspecific abnormal finding of lung field: Secondary | ICD-10-CM | POA: Diagnosis not present

## 2020-12-22 ENCOUNTER — Ambulatory Visit: Payer: Medicare HMO | Admitting: Thoracic Surgery (Cardiothoracic Vascular Surgery)

## 2020-12-22 ENCOUNTER — Encounter: Payer: Self-pay | Admitting: Thoracic Surgery (Cardiothoracic Vascular Surgery)

## 2020-12-22 VITALS — BP 130/83 | HR 87 | Resp 20 | Ht 67.0 in | Wt 178.0 lb

## 2020-12-22 DIAGNOSIS — R918 Other nonspecific abnormal finding of lung field: Secondary | ICD-10-CM

## 2020-12-22 NOTE — Progress Notes (Signed)
[address]       [place] [address]             [phone]     HPI: Dale Thompson returns for a scheduled follow-up visit  Dale Thompson is a 65 year old man with a history of hyperlipidemia, prostate cancer, asthma, gout, Crohn's disease, remote tobacco abuse, aberrant right subclavian artery, aortic atherosclerosis, coronary atherosclerosis, and lung nodules.  Dale Thompson had a 22-pack-year history of smoking.  Dale Thompson quit in 1998.  In 2018 Dale Thompson was found to have bilateral groundglass opacities.  In December 2020 the left upper lobe nodule measures larger.  I did navigational bronchoscopy and April 2021.  Biopsy showed inflammation and scarring.  There was no evidence of malignancy.  Dale Thompson understood this did not rule out the possibility of malignancy but there was no indication for surgical resection at the time.  We will continue to follow him radiographically since then.  In the interim since his last visit Dale Thompson has been feeling well.  Dale Thompson is not had any respiratory problems.  Dale Thompson had a CT for coronary calcium score which showed him to be in the 84th percentile.  His Crestor dose was increased.  Dale Thompson says that Dale Thompson gets some discomfort or fullness in his chest when Dale Thompson first gets on his elliptical machine in the mornings.  Dale Thompson will belch and feels better.  Dale Thompson attributed this to drinking coffee before Dale Thompson got on the machine.  Past Medical History:  Diagnosis Date  . Asthma   . Cancer ([place])    prostate, basal cell skin cancer  . OSA (obstructive sleep apnea)   . Seasonal allergies     Current Outpatient Medications  Medication Sig Dispense Refill  . allopurinol (ZYLOPRIM) 100 MG tablet Take 200 mg by mouth daily.    Marland Kitchen aspirin 81 MG chewable tablet 1 tablet    . b complex vitamins tablet Take 1 tablet by mouth daily.    . budesonide (PULMICORT) 0.5 MG/2ML nebulizer solution Take 0.5 mg by nebulization daily. Rinse as directed twice daily  12  . budesonide-formoterol (SYMBICORT) 80-4.5  MCG/ACT inhaler Take 2 puffs first thing in am and then another 2 puffs about 12 hours later. 1 Inhaler 11  . Calcium Carbonate-Vitamin D (CALCIUM PLUS VITAMIN D PO) Take 1 tablet by mouth daily.    . Colchicine 0.6 MG CAPS 1 capsule    . denosumab (PROLIA) 60 MG/ML SOSY injection See admin instructions.    . fexofenadine (ALLEGRA) 180 MG tablet Take 180 mg by mouth daily.    . fluticasone (FLONASE) 50 MCG/ACT nasal spray Place 1 spray into both nostrils daily as needed for allergies or rhinitis.    . mometasone-formoterol (DULERA) 100-5 MCG/ACT AERO Inhale 2 puffs into the lungs 2 (two) times daily as needed for wheezing or shortness of breath.    . pantoprazole (PROTONIX) 40 MG tablet Take 40 mg by mouth daily.    . rosuvastatin (CRESTOR) 20 MG tablet Take 20 mg by mouth at bedtime.     No current facility-administered medications for this visit.    Physical Exam BP 130/83   Pulse 87   Resp 20   Ht 5' 7"  (1.702 m)   Wt 178 lb (80.7 kg)   SpO2 94% Comment: RA  BMI 27.73 kg/m  65 year old man in no acute distress Alert and oriented x3 with no focal deficits Lungs clear with equal breath sounds bilaterally Cardiac regular rate and rhythm normal  S1 and S2 No clubbing cyanosis or edema  Diagnostic Tests: CT CHEST WITHOUT CONTRAST  TECHNIQUE: Multidetector CT imaging of the chest was performed following the standard protocol without IV contrast.  COMPARISON:  Prior chest CTs dating back to 2020. The most recent scan is 06/03/2019  FINDINGS: Cardiovascular: The heart is normal in size. No pericardial effusion. Stable tortuosity and age advanced atherosclerotic calcifications involving the thoracic aorta. There are also age advanced coronary artery calcifications.  Mediastinum/Nodes: Stable borderline mediastinal and hilar lymph nodes likely reactive and secondary to the patient's emphysema. The esophagus is grossly normal.  Lungs/Pleura: Stable emphysematous changes.  No acute overlying pulmonary process. Biapical pleural and parenchymal scarring changes are stable.  Stable (when remeasured) appearing sub solid/ground-glass opacity in the left upper lobe on image number 37/5 measuring a maximum of 17 mm.  Stable band of perifissural scarring changes in the right upper lobe adjacent to the major fissure. Linear appearance on the sagittal images with maximum measurement of 9.5 mm (image 42/4.  Streaky basilar scarring changes and both lower lobes. Stable left lower lobe bronchiectasis.  No new pulmonary lesions or pleural nodules.  No pleural effusion.  Upper Abdomen: No significant upper abdominal findings. Scattered hepatic calcified granulomas. No upper abdominal adenopathy. Age advanced vascular disease.  Musculoskeletal: No chest wall mass, supraclavicular or axillary adenopathy. The bony thorax is intact.  IMPRESSION: 1. Stable emphysematous changes and pulmonary scarring. 2. Stable sub solid/ground-glass opacity in the left upper lobe. Recommend follow-up noncontrast chest CT in 12 months. 3. Stable band of perifissural scarring changes in the right upper lobe. 4. No new pulmonary lesions or pleural nodules. 5. Stable borderline mediastinal and hilar lymph nodes. 6. Age advanced vascular disease. 7. Emphysema and aortic atherosclerosis.  Aortic Atherosclerosis (ICD10-I70.0) and Emphysema (ICD10-J43.9).   Electronically Signed   By: Marijo Sanes M.D.   On: 12/21/2020 15:27  I personally reviewed the CT images.  There are stable groundglass opacities in both lungs.  Dale Thompson does have atherosclerotic disease in the aorta and coronary arteries.  Impression: Dale Thompson is a 65 year old man with a history of hyperlipidemia, prostate cancer, asthma, gout, Crohn's disease, remote tobacco abuse, aberrant right subclavian artery, aortic atherosclerosis, coronary atherosclerosis, and lung nodules.  Dale Thompson had a 22-pack-year history of  smoking.  Dale Thompson quit in 1998.  Lung nodules-stable groundglass opacities both upper lobes.  These have been followed for several years now, and were biopsied about a year and a half ago.  I am still concerned over the possibility of these being low-grade adenocarcinomas.  I think we need to continue to follow him closely.  I will plan to do another CT in about 6 months.  Coronary atherosclerosis-calcium burden in the 84th percentile.  His Crestor was increased.  Dale Thompson does not have any classic angina but does have some belching when Dale Thompson first starts to exert himself.  I told him about the possibility of belching angina.  Dale Thompson is can try walking on the treadmill without drinking coffee first to see if Dale Thompson still has the symptoms.  If Dale Thompson does Dale Thompson will talk to cardiology about that.  Plan: Return in 6 months with CT chest to follow-up lung nodules  Melrose Nakayama, MD [facility] [phone]

## 2021-01-11 DIAGNOSIS — M810 Age-related osteoporosis without current pathological fracture: Secondary | ICD-10-CM | POA: Diagnosis not present

## 2021-01-27 DIAGNOSIS — M7061 Trochanteric bursitis, right hip: Secondary | ICD-10-CM | POA: Diagnosis not present

## 2021-01-27 DIAGNOSIS — M810 Age-related osteoporosis without current pathological fracture: Secondary | ICD-10-CM | POA: Diagnosis not present

## 2021-03-23 DIAGNOSIS — C61 Malignant neoplasm of prostate: Secondary | ICD-10-CM | POA: Diagnosis not present

## 2021-03-29 DIAGNOSIS — C61 Malignant neoplasm of prostate: Secondary | ICD-10-CM | POA: Diagnosis not present

## 2021-03-29 DIAGNOSIS — N5201 Erectile dysfunction due to arterial insufficiency: Secondary | ICD-10-CM | POA: Diagnosis not present

## 2021-03-29 DIAGNOSIS — E291 Testicular hypofunction: Secondary | ICD-10-CM | POA: Diagnosis not present

## 2021-04-29 DIAGNOSIS — J45909 Unspecified asthma, uncomplicated: Secondary | ICD-10-CM | POA: Diagnosis not present

## 2021-04-29 DIAGNOSIS — Z7982 Long term (current) use of aspirin: Secondary | ICD-10-CM | POA: Diagnosis not present

## 2021-04-29 DIAGNOSIS — M1990 Unspecified osteoarthritis, unspecified site: Secondary | ICD-10-CM | POA: Diagnosis not present

## 2021-04-29 DIAGNOSIS — E785 Hyperlipidemia, unspecified: Secondary | ICD-10-CM | POA: Diagnosis not present

## 2021-04-29 DIAGNOSIS — K219 Gastro-esophageal reflux disease without esophagitis: Secondary | ICD-10-CM | POA: Diagnosis not present

## 2021-04-29 DIAGNOSIS — I2510 Atherosclerotic heart disease of native coronary artery without angina pectoris: Secondary | ICD-10-CM | POA: Diagnosis not present

## 2021-04-29 DIAGNOSIS — M109 Gout, unspecified: Secondary | ICD-10-CM | POA: Diagnosis not present

## 2021-04-29 DIAGNOSIS — M810 Age-related osteoporosis without current pathological fracture: Secondary | ICD-10-CM | POA: Diagnosis not present

## 2021-04-29 DIAGNOSIS — R030 Elevated blood-pressure reading, without diagnosis of hypertension: Secondary | ICD-10-CM | POA: Diagnosis not present

## 2021-04-29 DIAGNOSIS — G4733 Obstructive sleep apnea (adult) (pediatric): Secondary | ICD-10-CM | POA: Diagnosis not present

## 2021-04-29 DIAGNOSIS — C61 Malignant neoplasm of prostate: Secondary | ICD-10-CM | POA: Diagnosis not present

## 2021-04-29 DIAGNOSIS — Z7951 Long term (current) use of inhaled steroids: Secondary | ICD-10-CM | POA: Diagnosis not present

## 2021-05-17 DIAGNOSIS — M810 Age-related osteoporosis without current pathological fracture: Secondary | ICD-10-CM | POA: Diagnosis not present

## 2021-05-17 DIAGNOSIS — M1990 Unspecified osteoarthritis, unspecified site: Secondary | ICD-10-CM | POA: Diagnosis not present

## 2021-05-17 DIAGNOSIS — K219 Gastro-esophageal reflux disease without esophagitis: Secondary | ICD-10-CM | POA: Diagnosis not present

## 2021-05-17 DIAGNOSIS — E785 Hyperlipidemia, unspecified: Secondary | ICD-10-CM | POA: Diagnosis not present

## 2021-05-31 ENCOUNTER — Other Ambulatory Visit: Payer: Self-pay | Admitting: Thoracic Surgery (Cardiothoracic Vascular Surgery)

## 2021-05-31 DIAGNOSIS — R918 Other nonspecific abnormal finding of lung field: Secondary | ICD-10-CM

## 2021-06-21 DIAGNOSIS — Z85828 Personal history of other malignant neoplasm of skin: Secondary | ICD-10-CM | POA: Diagnosis not present

## 2021-06-21 DIAGNOSIS — L821 Other seborrheic keratosis: Secondary | ICD-10-CM | POA: Diagnosis not present

## 2021-06-21 DIAGNOSIS — D225 Melanocytic nevi of trunk: Secondary | ICD-10-CM | POA: Diagnosis not present

## 2021-06-29 ENCOUNTER — Other Ambulatory Visit: Payer: Self-pay

## 2021-06-29 ENCOUNTER — Encounter: Payer: Self-pay | Admitting: Thoracic Surgery (Cardiothoracic Vascular Surgery)

## 2021-06-29 ENCOUNTER — Ambulatory Visit
Admission: RE | Admit: 2021-06-29 | Discharge: 2021-06-29 | Disposition: A | Discharge status: Home / Self Care | Payer: Medicare HMO | Source: Ambulatory Visit | Attending: Thoracic Surgery (Cardiothoracic Vascular Surgery) | Admitting: Thoracic Surgery (Cardiothoracic Vascular Surgery)

## 2021-06-29 ENCOUNTER — Ambulatory Visit: Payer: Medicare HMO | Admitting: Thoracic Surgery (Cardiothoracic Vascular Surgery)

## 2021-06-29 VITALS — BP 130/79 | HR 85 | Resp 20 | Wt 170.0 lb

## 2021-06-29 DIAGNOSIS — R911 Solitary pulmonary nodule: Secondary | ICD-10-CM | POA: Diagnosis not present

## 2021-06-29 DIAGNOSIS — I700 Atherosclerosis of aorta: Secondary | ICD-10-CM | POA: Diagnosis not present

## 2021-06-29 DIAGNOSIS — R918 Other nonspecific abnormal finding of lung field: Secondary | ICD-10-CM

## 2021-06-29 DIAGNOSIS — J439 Emphysema, unspecified: Secondary | ICD-10-CM | POA: Diagnosis not present

## 2021-06-29 NOTE — Progress Notes (Signed)
[address]       ,[place] [address]             [phone]      HPI: Mr. Dale Thompson returns for a scheduled follow up re: lung nodules  Dale Thompson is a 65 year old man with a history of hyperlipidemia, prostate cancer, asthma, gout, Crohn's disease, remote tobacco abuse, aberrant right subclavian artery, aortic atherosclerosis, coronary atherosclerosis, and lung nodules.  He had a 22-pack-year history of smoking.  He quit smoking in 1998.  He was found to have bilateral pulmonary groundglass opacities in 2018.  I have been following him since then.  In December 2020 a left upper lobe nodule had increased in size.  Navigational bronchoscopy in April 2021 showed inflammation and scarring.  There was no evidence of malignancy.  We have continued to follow him.  In the interval since his last visit he has been feeling well.  He is not having any chest pain, pressure, or tightness.  He has noted more nasal congestion and occasional cough over the past several weeks.  He has attributed that to allergies.  No fevers, chills, or night sweats.  Past Medical History:  Diagnosis Date   Asthma    Cancer ([place])    prostate, basal cell skin cancer   OSA (obstructive sleep apnea)    Seasonal allergies     Current Outpatient Medications  Medication Sig Dispense Refill   allopurinol (ZYLOPRIM) 100 MG tablet Take 200 mg by mouth daily.     aspirin 81 MG chewable tablet 1 tablet     b complex vitamins tablet Take 1 tablet by mouth daily.     budesonide (PULMICORT) 0.5 MG/2ML nebulizer solution Take 0.5 mg by nebulization daily. Rinse as directed twice daily  12   budesonide-formoterol (SYMBICORT) 80-4.5 MCG/ACT inhaler Take 2 puffs first thing in am and then another 2 puffs about 12 hours later. 1 Inhaler 11   Calcium Carbonate-Vitamin D (CALCIUM PLUS VITAMIN D PO) Take 1 tablet by mouth daily.     fexofenadine (ALLEGRA) 180 MG tablet Take 180 mg by mouth daily.      pantoprazole (PROTONIX) 40 MG tablet Take 40 mg by mouth daily.     rosuvastatin (CRESTOR) 20 MG tablet Take 20 mg by mouth at bedtime.     No current facility-administered medications for this visit.    Physical Exam BP 130/79 (BP Location: Left Arm, Patient Position: Sitting)    Pulse 85    Resp 20    Wt 170 lb (77.1 kg)    SpO2 96%    BMI 26.4 kg/m  65 year old man in no acute distress Alert and oriented x3 with no focal deficits Lungs clear bilaterally with no rales or wheezing Cardiac regular rate and rhythm No cervical or supraclavicular adenopathy  Diagnostic Tests: CT CHEST WITHOUT CONTRAST   TECHNIQUE: Multidetector CT imaging of the chest was performed following the standard protocol without IV contrast.   COMPARISON:  Multiple priors, most recent CT chest dated December 21, 2020   FINDINGS: Cardiovascular: Normal heart size. No pericardial effusion. Coronary artery calcifications of the LAD and circumflex. Atherosclerotic disease of the thoracic aorta. Aberrant course of the right subclavian artery.   Mediastinum/Nodes: Esophagus and thyroid are unremarkable. No pathologically enlarged lymph nodes seen in the chest.   Lungs/Pleura: Central airways are patent. Centrilobular emphysema. Stable left upper lobe subsolid nodule measuring up to 17 mm on series 8, image 37.  New clustered solid nodule of the posterior right upper lobe, largest measures 5 mm on series 8, image 55. Additional irregular linear solid nodules seen in the bilateral lower lobes and right middle lobe on image 72, 112, and 75, unchanged compared to prior and likely scarring.   Upper Abdomen: No acute abnormality.   Musculoskeletal: No chest wall mass or suspicious bone lesions identified.   IMPRESSION: 1. Stable subsolid nodule of the left upper lobe, indolent primary adenocarcinoma could have this appearance. Recommend continued surveillance. 2. New clustered solid nodules the posterior right  upper lobe, likely due to infection or aspiration. Recommend 6 month follow-up CT to ensure resolution. 3. Stable bilateral linear, nodular opacities which likely represent scarring. 4. Aortic Atherosclerosis (ICD10-I70.0) and Emphysema (ICD10-J43.9).     Electronically Signed   By: Yetta Glassman M.D.   On: 06/29/2021 14:56 I personally reviewed the chest CT images.  Left upper lobe groundglass opacity unchanged.  New 5 mm nodule posterior inferior right upper lobe.  Other nodular areas unchanged.  Impression: Dale Thompson is a 65 year old man with a history of hyperlipidemia, prostate cancer, asthma, gout, Crohn's disease, remote tobacco abuse, aberrant right subclavian artery, aortic atherosclerosis, coronary atherosclerosis, and lung nodules.  He had a 22-pack-year history of smoking.  He quit smoking in 1998.  Lung nodules-first noted in 2018.  He has been followed since then.  Navigational bronchoscopy of the left upper lobe nodule in April 2021 showed inflammation, but no evidence of malignancy.  He understands that that does not rule out the possibility of malignancy.  The nodule has remained stable in the interim since that time.  He has had some other waxing and waning nodules.  On today's scan he has a new 5 mm nodule in the posterior right upper lobe.  Overall this is likely infectious/inflammatory in nature, but there is still a possibility this could be malignant.  He needs continued follow-up.  Plan: Return in 6 months with CT chest to f/u lung nodules  I spent 20 minutes in review of records, images, and in consultation with Mr. Chrostowski today. Melrose Nakayama, MD [facility] [phone]

## 2021-07-29 DIAGNOSIS — K219 Gastro-esophageal reflux disease without esophagitis: Secondary | ICD-10-CM | POA: Diagnosis not present

## 2021-07-29 DIAGNOSIS — Z125 Encounter for screening for malignant neoplasm of prostate: Secondary | ICD-10-CM | POA: Diagnosis not present

## 2021-07-29 DIAGNOSIS — E785 Hyperlipidemia, unspecified: Secondary | ICD-10-CM | POA: Diagnosis not present

## 2021-07-29 DIAGNOSIS — M810 Age-related osteoporosis without current pathological fracture: Secondary | ICD-10-CM | POA: Diagnosis not present

## 2021-08-02 DIAGNOSIS — E789 Disorder of lipoprotein metabolism, unspecified: Secondary | ICD-10-CM | POA: Diagnosis not present

## 2021-08-02 DIAGNOSIS — G4733 Obstructive sleep apnea (adult) (pediatric): Secondary | ICD-10-CM | POA: Diagnosis not present

## 2021-08-02 DIAGNOSIS — I2510 Atherosclerotic heart disease of native coronary artery without angina pectoris: Secondary | ICD-10-CM | POA: Diagnosis not present

## 2021-08-02 DIAGNOSIS — M1A9XX0 Chronic gout, unspecified, without tophus (tophi): Secondary | ICD-10-CM | POA: Diagnosis not present

## 2021-08-02 DIAGNOSIS — K219 Gastro-esophageal reflux disease without esophagitis: Secondary | ICD-10-CM | POA: Diagnosis not present

## 2021-08-02 DIAGNOSIS — C61 Malignant neoplasm of prostate: Secondary | ICD-10-CM | POA: Diagnosis not present

## 2021-08-02 DIAGNOSIS — I700 Atherosclerosis of aorta: Secondary | ICD-10-CM | POA: Diagnosis not present

## 2021-08-02 DIAGNOSIS — M810 Age-related osteoporosis without current pathological fracture: Secondary | ICD-10-CM | POA: Diagnosis not present

## 2021-08-02 DIAGNOSIS — Z0000 Encounter for general adult medical examination without abnormal findings: Secondary | ICD-10-CM | POA: Diagnosis not present

## 2021-08-02 DIAGNOSIS — J302 Other seasonal allergic rhinitis: Secondary | ICD-10-CM | POA: Diagnosis not present

## 2021-08-11 DIAGNOSIS — Z20822 Contact with and (suspected) exposure to covid-19: Secondary | ICD-10-CM | POA: Diagnosis not present

## 2021-08-18 DIAGNOSIS — U071 COVID-19: Secondary | ICD-10-CM

## 2021-08-18 HISTORY — DX: COVID-19: U07.1

## 2021-08-24 DIAGNOSIS — U071 COVID-19: Secondary | ICD-10-CM | POA: Diagnosis not present

## 2021-08-24 DIAGNOSIS — Z789 Other specified health status: Secondary | ICD-10-CM | POA: Diagnosis not present

## 2021-08-24 DIAGNOSIS — Z6826 Body mass index (BMI) 26.0-26.9, adult: Secondary | ICD-10-CM | POA: Diagnosis not present

## 2021-08-25 ENCOUNTER — Ambulatory Visit: Payer: Medicare HMO | Admitting: Pulmonary Disease

## 2021-09-07 ENCOUNTER — Ambulatory Visit: Payer: Medicare HMO | Admitting: Cardiovascular Disease

## 2021-09-14 ENCOUNTER — Ambulatory Visit: Payer: Medicare HMO | Admitting: Cardiovascular Disease

## 2021-09-14 ENCOUNTER — Encounter: Payer: Self-pay | Admitting: Cardiovascular Disease

## 2021-09-14 ENCOUNTER — Other Ambulatory Visit: Payer: Self-pay

## 2021-09-14 DIAGNOSIS — I2510 Atherosclerotic heart disease of native coronary artery without angina pectoris: Secondary | ICD-10-CM

## 2021-09-14 DIAGNOSIS — E782 Mixed hyperlipidemia: Secondary | ICD-10-CM | POA: Diagnosis not present

## 2021-09-14 NOTE — Assessment & Plan Note (Signed)
History of hyperlipidemia on rosuvastatin 20 mg a day followed by his PCP.

## 2021-09-14 NOTE — Progress Notes (Signed)
09/14/2021 Dale Thompson   1956-01-13  [id number]  Primary Physician Deland Pretty, MD Primary Cardiologist: Lorretta Harp MD Lupe Carney, [place]  HPI:  Dale Thompson is a 66 y.o.  thin and fit appearing married Caucasian male father 2, grandfather 2 grandchildren who is retired from working at [employer] for 36 years.  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  He also has a [place].  He was referred by Dr. Shelia Media, his primary care physician, for evaluation of coronary calcification seen on screening chest CT. I last saw him in the office 09/09/2020.  His risk factors include treated hyperlipidemia currently on rosuvastatin.  There is no family history for heart disease.  Never had a heart tach or stroke.  Has had remote Crohn's disease back in 15s which has been quiesced sent and low grade prostate cancer which is being carefully surveyed.  He works out on the elliptical for an hour a day without symptoms.  He did get evaluated at [facility] cardiology in 2015 with a negative GXT and 2D echo.  He is followed by Dr. Roxan Hockey for a pulmonary nodule.  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  He remains active and is completely asymptomatic.  He did have COVID this past January that lasted for several days.  He was treated with Paxlovid by his PCP.  Should be noted that he did have COVID-19 vaccination as well as several boosters.   Current Meds  Medication Sig   allopurinol (ZYLOPRIM) 100 MG tablet Take 200 mg by mouth daily.   aspirin 81 MG chewable tablet 1 tablet   b complex vitamins tablet Take 1 tablet by mouth daily.   budesonide (PULMICORT) 0.5 MG/2ML nebulizer solution Take 0.5 mg by nebulization as needed.   budesonide-formoterol (SYMBICORT) 80-4.5 MCG/ACT inhaler Take 2  puffs first thing in am and then another 2 puffs about 12 hours later.   Calcium Carbonate-Vitamin D (CALCIUM PLUS VITAMIN D PO) Take 1 tablet by mouth daily.   fexofenadine (ALLEGRA) 180 MG tablet Take 180 mg by mouth daily.   Flaxseed, Linseed, (FLAXSEED OIL PO) Take by mouth.   pantoprazole (PROTONIX) 40 MG tablet Take 40 mg by mouth daily.   rosuvastatin (CRESTOR) 20 MG tablet Take 20 mg by mouth at bedtime.   VITAMIN D PO Take by mouth.     No Known Allergies  Social History   Socioeconomic History   Marital status: Married    Spouse name: Not on file   Number of children: 2   Years of education: Not on file   Highest education level: Not on file  Occupational History   Occupation: Executive  Tobacco Use   Smoking status: Former    Packs/day: 1.00    Years: 22.00    Pack years: 22.00    Types: Cigarettes    Quit date: 05/18/1997    Years since quitting: 24.3   Smokeless tobacco: Never  Vaping Use   Vaping Use: Never used  Substance and Sexual Activity   Alcohol use: Yes    Comment: 12 beers per wk   Drug use: No   Sexual activity: Not on file  Other Topics Concern   Not on file  Social History Narrative   Not on file   Social  Determinants of Health   Financial Resource Strain: Not on file  Food Insecurity: Not on file  Transportation Needs: Not on file  Physical Activity: Not on file  Stress: Not on file  Social Connections: Not on file  Intimate Partner Violence: Not on file     Review of Systems: General: negative for chills, fever, night sweats or weight changes.  Cardiovascular: negative for chest pain, dyspnea on exertion, edema, orthopnea, palpitations, paroxysmal nocturnal dyspnea or shortness of breath Dermatological: negative for rash Respiratory: negative for cough or wheezing Urologic: negative for hematuria Abdominal: negative for nausea, vomiting, diarrhea, bright red blood per rectum, melena, or hematemesis Neurologic: negative for visual  changes, syncope, or dizziness All other systems reviewed and are otherwise negative except as noted above.    Blood pressure 140/86, pulse 70, height 5' 8"  (1.727 m), weight 180 lb 6.4 oz (81.8 kg), SpO2 96 %.  General appearance: alert and no distress Neck: no adenopathy, no carotid bruit, no JVD, supple, symmetrical, trachea midline, and thyroid not enlarged, symmetric, no tenderness/mass/nodules Lungs: clear to auscultation bilaterally Heart: regular rate and rhythm, S1, S2 normal, no murmur, click, rub or gallop Extremities: extremities normal, atraumatic, no cyanosis or edema Pulses: 2+ and symmetric Skin: Skin color, texture, turgor normal. No rashes or lesions Neurologic: Grossly normal  EKG sinus rhythm at 70 with arm lead reversal.  Personally reviewed this EKG.  ASSESSMENT AND PLAN:   Hyperlipidemia History of hyperlipidemia on rosuvastatin 20 mg a day followed by his PCP.  Coronary artery calcification seen on CT scan History of elevated coronary calcium score performed 10/02/2020 at 552.  He does have a chest CT ordered by Dr. Roxan Hockey 06/29/2021 revealing calcification in the LAD and circumflex.  He is very active and denies chest pain or shortness of breath.     Lorretta Harp MD FACP,FACC,FAHA, [facility] 09/14/2021 10:52 AM

## 2021-09-14 NOTE — Assessment & Plan Note (Signed)
History of elevated coronary calcium score performed 10/02/2020 at 552.  He does have a chest CT ordered by Dr. Roxan Hockey 06/29/2021 revealing calcification in the LAD and circumflex.  He is very active and denies chest pain or shortness of breath.

## 2021-09-14 NOTE — Patient Instructions (Signed)
Medication Instructions:  Your physician recommends that you continue on your current medications as directed. Please refer to the Current Medication list given to you today.  *If you need a refill on your cardiac medications before your next appointment, please call your pharmacy*   Follow-Up: At [facility], you and your health needs are our priority.  As part of our continuing mission to provide you with exceptional heart care, we have created designated Provider Care Teams.  These Care Teams include your primary Cardiologist (physician) and Advanced Practice Providers (APPs -  Physician Assistants and Nurse Practitioners) who all work together to provide you with the care you need, when you need it.  We recommend signing up for the patient portal called "MyChart".  Sign up information is provided on this After Visit Summary.  MyChart is used to connect with patients for Virtual Visits (Telemedicine).  Patients are able to view lab/test results, encounter notes, upcoming appointments, etc.  Non-urgent messages can be sent to your provider as well.   To learn more about what you can do with MyChart, go to [email or web address].    Your next appointment:   12 month(s)  The format for your next appointment:   In Person  Provider:   Quay Burow, MD

## 2021-09-15 DIAGNOSIS — K219 Gastro-esophageal reflux disease without esophagitis: Secondary | ICD-10-CM | POA: Diagnosis not present

## 2021-09-15 DIAGNOSIS — R69 Illness, unspecified: Secondary | ICD-10-CM | POA: Diagnosis not present

## 2021-09-15 DIAGNOSIS — Z1211 Encounter for screening for malignant neoplasm of colon: Secondary | ICD-10-CM | POA: Diagnosis not present

## 2021-09-21 ENCOUNTER — Encounter: Payer: Self-pay | Admitting: Pulmonary Disease

## 2021-09-21 ENCOUNTER — Ambulatory Visit: Payer: Medicare HMO | Admitting: Pulmonary Disease

## 2021-09-21 ENCOUNTER — Other Ambulatory Visit: Payer: Self-pay

## 2021-09-21 VITALS — BP 122/68 | HR 80 | Temp 98.0°F | Ht 68.0 in | Wt 178.2 lb

## 2021-09-21 DIAGNOSIS — C61 Malignant neoplasm of prostate: Secondary | ICD-10-CM | POA: Diagnosis not present

## 2021-09-21 DIAGNOSIS — J432 Centrilobular emphysema: Secondary | ICD-10-CM | POA: Diagnosis not present

## 2021-09-21 DIAGNOSIS — G4733 Obstructive sleep apnea (adult) (pediatric): Secondary | ICD-10-CM

## 2021-09-21 NOTE — Progress Notes (Signed)
? ?[facility] ? ?Chief Complaint  ?Patient presents with  ? Consult  ?  Sleep apnea  ? ? ?Past Surgical History:  ?He  has a past surgical history that includes Colon surgery (1979) and Video bronchoscopy with endobronchial navigation (N/A, 10/17/2019). ? ?Past Medical History:  ?Prostate cancer, Allergies, Gout, Crohn's disease, HLD, Coronary atherosclerosis, COVID 19 infection February 2023, Nasal polyposis ? ?Constitutional:  ?BP 122/68 (BP Location: Right Arm, Patient Position: Sitting, Cuff Size: Normal)   Pulse 80   Temp 98 ?F (36.7 ?C) (Oral)   Ht 5' 8"  (1.727 m)   Wt 178 lb 3.2 oz (80.8 kg)   SpO2 96%   BMI 27.10 kg/m?  ? ?Brief Summary:  ?Dale Thompson is a 66 y.o. male former smoker with obstructive sleep apnea and emphysema. ?  ? ? ? ?Subjective:  ? ?He had a sleep study about 12 to 14 years ago.  Reportedly showed sleep apnea.  He purchased a CPAP on his own.  He has the same machine for the past 12 years.  Gets supplies through [email or web address].  Got a travel CPAP about 4 years ago [place]  If he sleeps without CPAP then he snores and stops breathing.  His breathing is more restless and he feels sleep during the day.  He can't sleep without using CPAP. ? ?He quit smoking in 1998.  He gets intermittent allergies and uses symbicort prn for asthma.  He used to see Dr. Harold Hedge with [place] [facility] and asthma, and was previously on allergy shots.  He has sinus surgery at [facility] for nasal polyposis.  He doesn't usually have a cough, wheeze, or sputum.  He doesn't feel like his breathing limits his activity.  He is followed by thoracic surgery for lung nodules.  His CT chest from December 2022 showed changes of emphysema. ? ?Epworth score is 5 out of 24. ? ?Physical Exam:  ? ?Appearance - well kempt  ? ?ENMT - no sinus tenderness, no oral exudate, no LAN, Mallampati 4 airway, no stridor, scalloped tongue ? ?Respiratory - equal breath sounds  bilaterally, no wheezing or rales ? ?CV - s1s2 regular rate and rhythm, no murmurs ? ?Ext - no clubbing, no edema ? ?Skin - no rashes ? ?Psych - normal mood and affect ?  ?Pulmonary testing:  ? ? ?Chest Imaging:  ?CT chest 06/29/21 >> atherosclerosis, centrilobular emphysema, 17 mm nodule LUL stable, new nodule cluster RUL up to 5 mm, smaller nodules in lower lobes b/l and RML ? ?Sleep Tests:  ? ? ?Social History:  ?He  reports that he quit smoking about 24 years ago. His smoking use included cigarettes. He has a 22.00 pack-year smoking history. He has never used smokeless tobacco. He reports current alcohol use. He reports that he does not use drugs. ? ?Family History:  ?His family history includes Heart disease in his maternal grandfather. ?  ? ?Discussion:  ?He has long standing history of sleep apnea and has been compliant with CPAP.  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  He is debating whether he wants to have this covered by insurance since he will need a replacement CPAP machine. ? ?He has prior history of smoking.  He has history of allergies and asthma.  His recent CT chest showed changes of emphysema.  He has minimal respiratory symptoms at present. ? ?Assessment/Plan:  ? ?Snoring with excessive daytime sleepiness with history of obstructive  sleep apnea. ?- will arrange for home sleep study ?- he will check with his insurance to determine his out of pocket expense, and then decide if he wants to get set up through insurance for new CPAP or order online again ? ?History of tobacco abuse with centrilobular emphysema and history of allergies/asthma with nasal polyposis. ?- minimal respiratory symptoms at present ?- prn symbicort ?- defer PFT for now ?- he uses budesonide for sinus rinse; followed previously [facility] ENT at [facility] ? ?Lung nodules. ?- followed by Dr. Modesto Charon with cardiothoracic surgery ? ?Obesity. ?- discussed how weight can impact sleep and risk for sleep disordered breathing ?-  discussed options to assist with weight loss: combination of diet modification, cardiovascular and strength training exercises ? ?Cardiovascular risk. ?- had an extensive discussion regarding the adverse health consequences related to untreated sleep disordered breathing ?- specifically discussed the risks for hypertension, coronary artery disease, cardiac dysrhythmias, cerebrovascular disease, and diabetes ?- lifestyle modification discussed ? ?Safe driving practices. ?- discussed how sleep disruption can increase risk of accidents, particularly when driving ?- safe driving practices were discussed ? ?Therapies for obstructive sleep apnea. ?- if the sleep study shows significant sleep apnea, then various therapies for treatment were reviewed: CPAP, oral appliance, and surgical interventions ? ? ? ?Time Spent Involved in Patient Care on Day of Examination:  ?47 minutes ? ?Follow up:  ? ?Patient Instructions  ?Will arrange for home sleep study ? ? ? ?Medication List:  ? ?Allergies as of 09/21/2021   ?No Known Allergies ?  ? ?  ?Medication List  ?  ? ?  ? Accurate as of September 21, 2021 10:02 AM. If you have any questions, ask your nurse or doctor.  ?  ?  ? ?  ? ?allopurinol 100 MG tablet ?Commonly known as: ZYLOPRIM ?Take 200 mg by mouth daily. ?  ?aspirin 81 MG chewable tablet ?1 tablet ?  ?b complex vitamins tablet ?Take 1 tablet by mouth daily. ?  ?budesonide 0.5 MG/2ML nebulizer solution ?Commonly known as: PULMICORT ?Take 0.5 mg by nebulization as needed. ?  ?budesonide-formoterol 80-4.5 MCG/ACT inhaler ?Commonly known as: Symbicort ?Take 2 puffs first thing in am and then another 2 puffs about 12 hours later. ?  ?CALCIUM PLUS VITAMIN D PO ?Take 1 tablet by mouth daily. ?  ?fexofenadine 180 MG tablet ?Commonly known as: ALLEGRA ?Take 180 mg by mouth daily. ?  ?FLAXSEED OIL PO ?Take by mouth. ?  ?pantoprazole 40 MG tablet ?Commonly known as: PROTONIX ?Take 40 mg by mouth daily. ?  ?rosuvastatin 20 MG tablet ?Commonly  known as: CRESTOR ?Take 20 mg by mouth at bedtime. ?  ?VITAMIN D PO ?Take by mouth. ?  ? ?  ? ? ?Signature:  ?Chesley Mires, MD ?[place] ?Pager - [phone] ?09/21/2021, 10:02 AM ?  ? ? ? ? ? ? ? ? ?

## 2021-09-21 NOTE — Patient Instructions (Signed)
Will arrange for home sleep study ? ? ?

## 2021-09-24 DIAGNOSIS — H25813 Combined forms of age-related cataract, bilateral: Secondary | ICD-10-CM | POA: Diagnosis not present

## 2021-09-24 DIAGNOSIS — H04123 Dry eye syndrome of bilateral lacrimal glands: Secondary | ICD-10-CM | POA: Diagnosis not present

## 2021-09-24 DIAGNOSIS — H40023 Open angle with borderline findings, high risk, bilateral: Secondary | ICD-10-CM | POA: Diagnosis not present

## 2021-09-28 DIAGNOSIS — E291 Testicular hypofunction: Secondary | ICD-10-CM | POA: Diagnosis not present

## 2021-09-28 DIAGNOSIS — C61 Malignant neoplasm of prostate: Secondary | ICD-10-CM | POA: Diagnosis not present

## 2021-10-04 IMAGING — CT CT CARDIAC CORONARY ARTERY CALCIUM SCORE
3 series · 14 of 20 positions shown, 15 images · non-contrast
Comparison: 06/02/2020
COMPARISON: 06/02/2020

Addendum:
EXAM:
OVER-READ INTERPRETATION  CT CHEST

The following report is an over-read performed by radiologist Dr.
Kapopo Aochamus [REDACTED] on 10/02/2020. This over-read
does not include interpretation of cardiac or coronary anatomy or
pathology. The calcium score interpretation by the cardiologist is
attached.
CLINICAL DATA: Risk stratification: 64 Year-old White Male
Coronary Calcium Score
TECHNIQUE: The patient was scanned on a Siemens Force scanner. Axial
non-contrast 3 mm slices were carried out through the heart. The
data set was analyzed on a dedicated work station and scored using
the Agatson method.

[Series 2: casc 3.0 bv41 2 bestdiast 69 % · axial · 0.42mm/px · z∈[-238,-156]mm · 4 of 45 slices shown, 5 images]
[im 9/45  vessel]
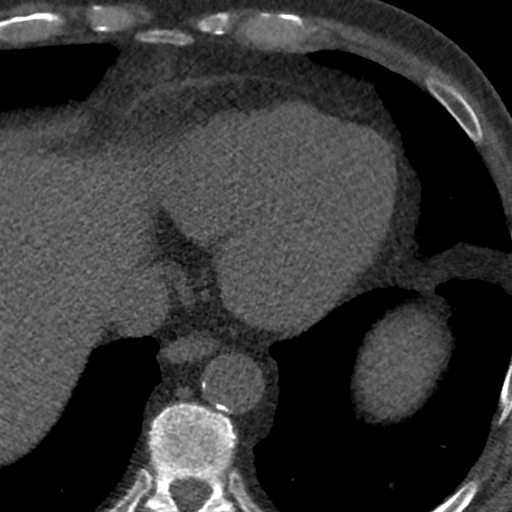
[im 9/45  lung]
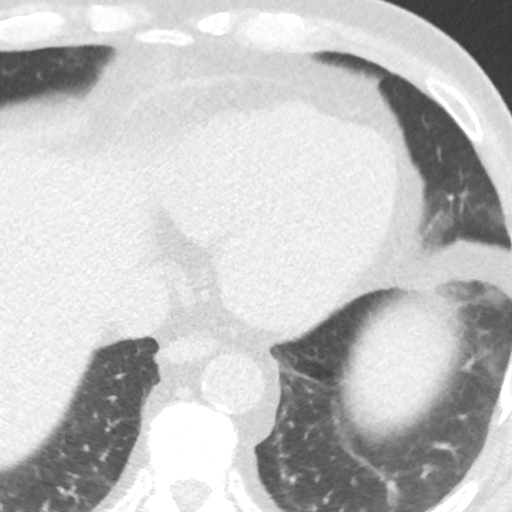
[im 18/45  vessel]
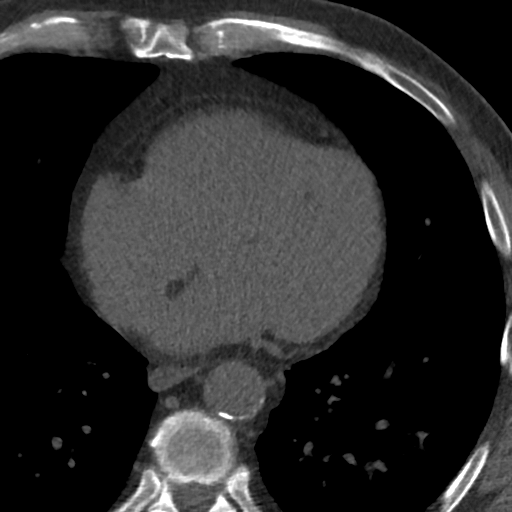
[im 27/45  vessel]
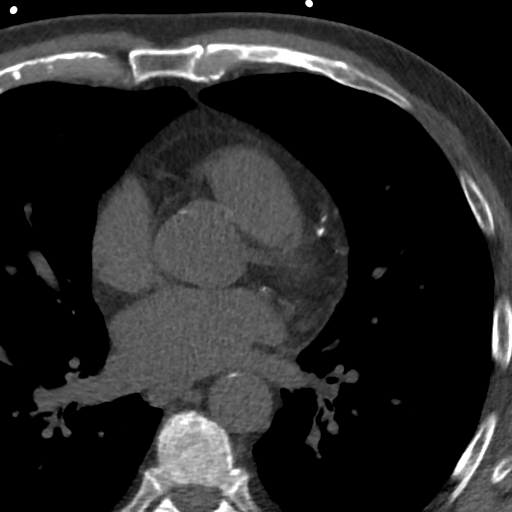
[im 36/45  vessel]
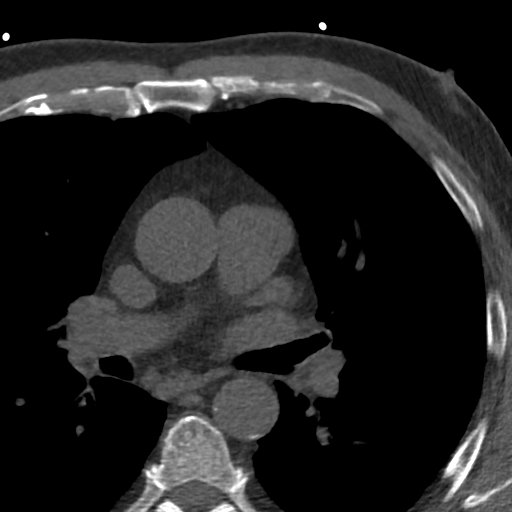

[Series 3: lung 69 % · axial · 0.68mm/px · z∈[-240,-154]mm · 5 of 45 slices shown]
[im 8/45  lung]
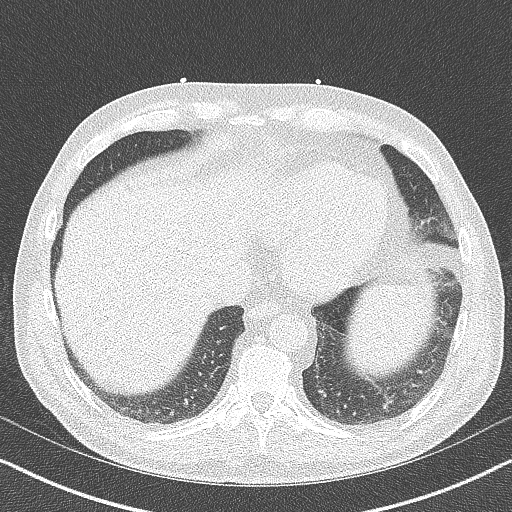
[im 15/45  lung]
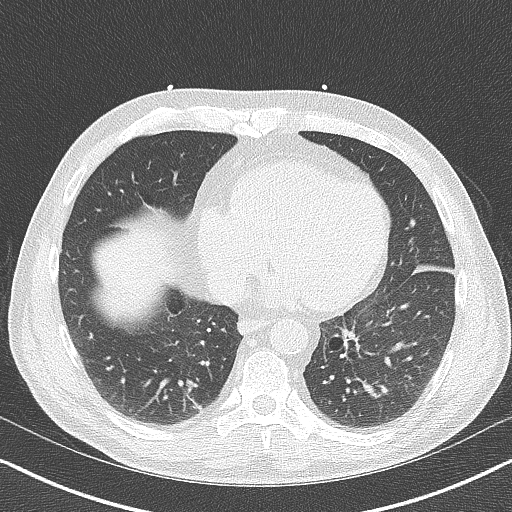
[im 23/45  lung]
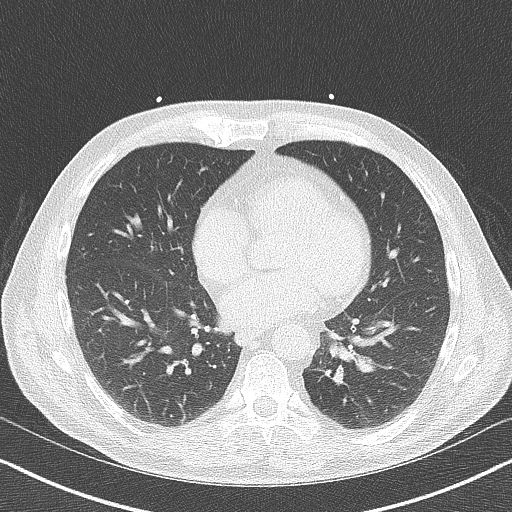
[im 30/45  lung]
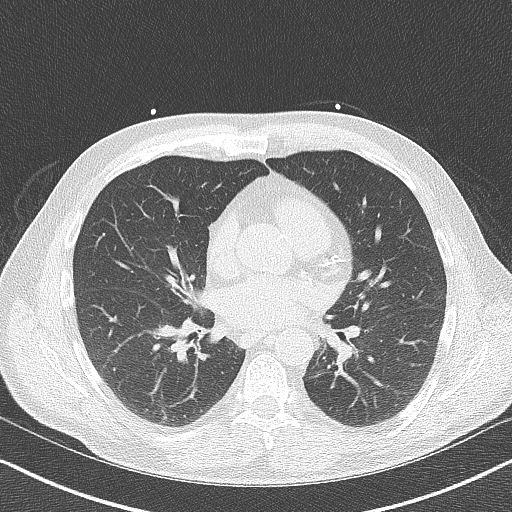
[im 37/45  lung]
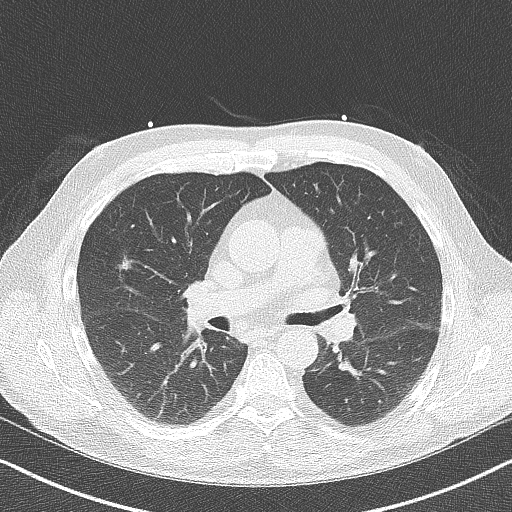

[Series 4: lung st 69 % · axial · 0.68mm/px · z∈[-240,-154]mm · 5 of 45 slices shown]
[im 8/45  lung]
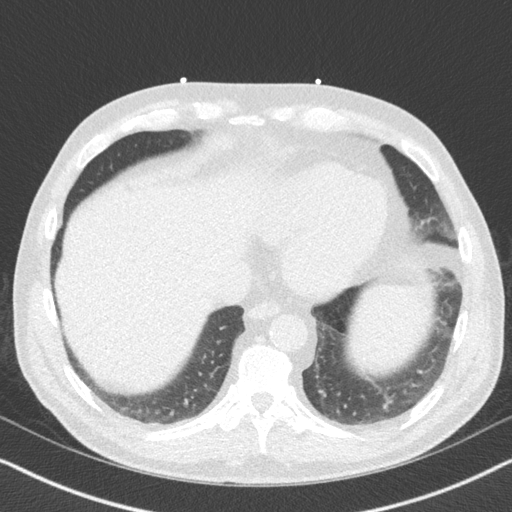
[im 15/45  lung]
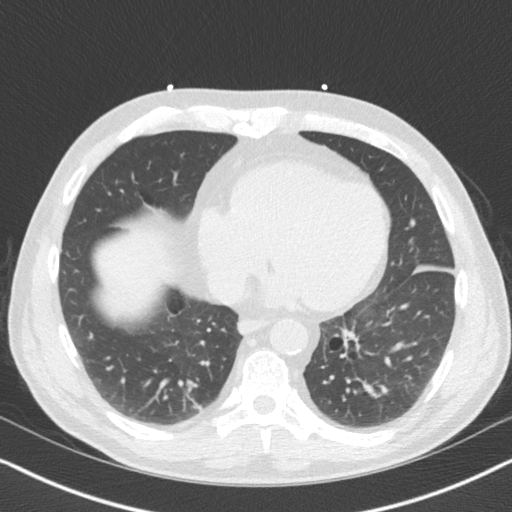
[im 23/45  lung]
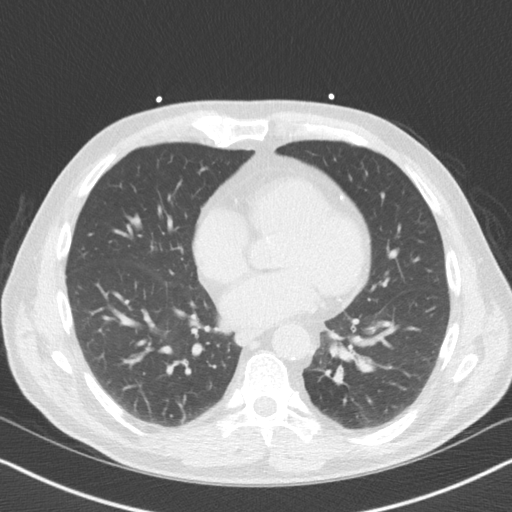
[im 30/45  lung]
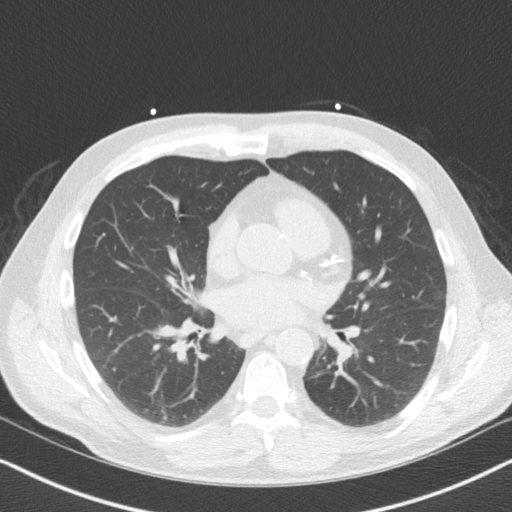
[im 37/45  lung]
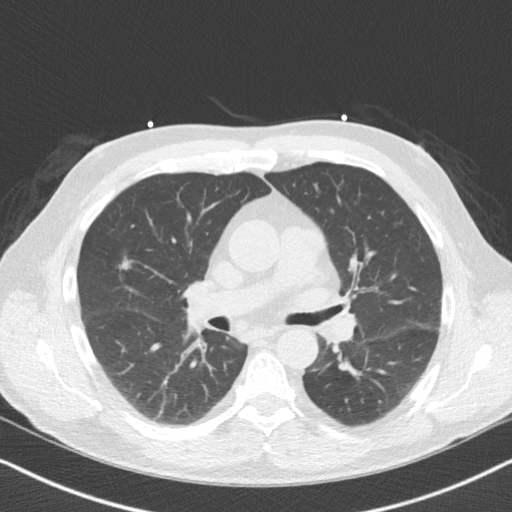

[14 of 20 positions shown; findings below may reference images not displayed]

FINDINGS: Vascular: Aortic atherosclerosis.

Mediastinum/Nodes: No imaged thoracic adenopathy.

Lungs/Pleura: No pleural fluid. Areas of soft tissue thickening
along the right minor fissure, including on [DATE], are unchanged and
likely indicative of scarring. There is also bibasilar and superior
segment right lower lobe scarring.

Upper Abdomen: Normal imaged portions of the liver, spleen, stomach.

Musculoskeletal: No acute osseous abnormality.
IMPRESSION: 1.  No acute findings in the imaged extracardiac chest.
2.  Aortic Atherosclerosis (I3GJF-HT4.4).
FINDINGS: Non-cardiac: See separate report from [REDACTED].

Ascending Aorta: Normal caliber. Aortic root and descending aortic
atherosclerosis noted.

Pericardium: Normal.

Coronary arteries: Normal origins.

Coronary calcium score of 552. This was 84th percentile for age,
gender, and race matched controls.
IMPRESSION: 1. Coronary calcium score of 552. This was 84th percentile for age,
gender, and race matched controls.

2. Aortic root and descending aortic atherosclerosis noted.

*** End of Addendum ***
EXAM:
OVER-READ INTERPRETATION  CT CHEST

The following report is an over-read performed by radiologist Dr.
Kapopo Aochamus [REDACTED] on 10/02/2020. This over-read
does not include interpretation of cardiac or coronary anatomy or
pathology. The calcium score interpretation by the cardiologist is
attached.
FINDINGS: Vascular: Aortic atherosclerosis.

Mediastinum/Nodes: No imaged thoracic adenopathy.

Lungs/Pleura: No pleural fluid. Areas of soft tissue thickening
along the right minor fissure, including on [DATE], are unchanged and
likely indicative of scarring. There is also bibasilar and superior
segment right lower lobe scarring.

Upper Abdomen: Normal imaged portions of the liver, spleen, stomach.

Musculoskeletal: No acute osseous abnormality.
IMPRESSION: 1.  No acute findings in the imaged extracardiac chest.
2.  Aortic Atherosclerosis (I3GJF-HT4.4).

## 2021-10-19 DIAGNOSIS — Z1211 Encounter for screening for malignant neoplasm of colon: Secondary | ICD-10-CM | POA: Diagnosis not present

## 2021-10-19 DIAGNOSIS — K5730 Diverticulosis of large intestine without perforation or abscess without bleeding: Secondary | ICD-10-CM | POA: Diagnosis not present

## 2021-10-19 DIAGNOSIS — K5669 Other partial intestinal obstruction: Secondary | ICD-10-CM | POA: Diagnosis not present

## 2021-11-08 ENCOUNTER — Encounter: Payer: Self-pay | Admitting: Pulmonary Disease

## 2021-11-23 ENCOUNTER — Ambulatory Visit: Payer: Medicare HMO

## 2021-11-23 DIAGNOSIS — G4733 Obstructive sleep apnea (adult) (pediatric): Secondary | ICD-10-CM

## 2021-11-24 ENCOUNTER — Other Ambulatory Visit: Payer: Self-pay | Admitting: Thoracic Surgery (Cardiothoracic Vascular Surgery)

## 2021-11-24 DIAGNOSIS — R918 Other nonspecific abnormal finding of lung field: Secondary | ICD-10-CM

## 2021-11-25 ENCOUNTER — Telehealth: Payer: Self-pay | Admitting: Pulmonary Disease

## 2021-11-25 DIAGNOSIS — G4733 Obstructive sleep apnea (adult) (pediatric): Secondary | ICD-10-CM

## 2021-11-25 NOTE — Telephone Encounter (Signed)
HST 11/23/21 >> AHI 43.6, SpO2 low 84% ? ? ?Please inform him that his sleep study shows severe obstructive sleep apnea.  Please arrange for ROV with me or NP to discuss treatment options. ? ? ? ? ?

## 2021-11-26 NOTE — Telephone Encounter (Signed)
Please send order to arrange for new DME and get Resmed auto CPAP 5 to 20 cm H2O with heated humidity and mask of choice.  He will need follow up in 4 months. ?

## 2021-11-26 NOTE — Telephone Encounter (Signed)
Spoke to patient and made him aware of recs and new  cpap order being placed. He voiced understanding. Will call after he receives new CPAP and set up a 4 month f/u   ?

## 2021-11-26 NOTE — Telephone Encounter (Signed)
Patient states he has been on a CPAP since 2009.  ?Would like to know if he can get a new order for a CPAP without coming in for an office visit.  ? ?Dr. Halford Chessman please advise? And if so is it okay to place an order for a new DME for a CPAP for patient?  ?Thanks!  ?

## 2021-12-16 DIAGNOSIS — G4733 Obstructive sleep apnea (adult) (pediatric): Secondary | ICD-10-CM | POA: Diagnosis not present

## 2022-01-04 ENCOUNTER — Ambulatory Visit: Payer: Medicare HMO | Admitting: Thoracic Surgery (Cardiothoracic Vascular Surgery)

## 2022-01-15 DIAGNOSIS — G4733 Obstructive sleep apnea (adult) (pediatric): Secondary | ICD-10-CM | POA: Diagnosis not present

## 2022-01-20 ENCOUNTER — Other Ambulatory Visit: Payer: Medicare HMO

## 2022-01-21 ENCOUNTER — Other Ambulatory Visit: Payer: Medicare HMO

## 2022-01-25 ENCOUNTER — Ambulatory Visit: Payer: Medicare HMO | Admitting: Thoracic Surgery (Cardiothoracic Vascular Surgery)

## 2022-02-08 ENCOUNTER — Encounter: Payer: Self-pay | Admitting: Adult Health

## 2022-02-08 ENCOUNTER — Ambulatory Visit: Payer: Medicare HMO | Admitting: Adult Health

## 2022-02-08 DIAGNOSIS — J4520 Mild intermittent asthma, uncomplicated: Secondary | ICD-10-CM

## 2022-02-08 DIAGNOSIS — G4733 Obstructive sleep apnea (adult) (pediatric): Secondary | ICD-10-CM | POA: Diagnosis not present

## 2022-02-08 DIAGNOSIS — J45909 Unspecified asthma, uncomplicated: Secondary | ICD-10-CM | POA: Insufficient documentation

## 2022-02-08 DIAGNOSIS — R911 Solitary pulmonary nodule: Secondary | ICD-10-CM

## 2022-02-08 NOTE — Assessment & Plan Note (Signed)
Mild intermittent asthma.  Continue on Symbicort as needed.  Asthma action plan discussed.  Control for triggers.  Plan  Patient Instructions  Continue on CPAP At bedtime   Keep up good work.  Do not drive if sleepy  Work on healthy weight  Symbicort As needed    CT chest as planned , w/ Dr. Roxan Hockey.   Follow up with Dr. Halford Chessman  in 1 year and As needed

## 2022-02-08 NOTE — Patient Instructions (Addendum)
Continue on CPAP At bedtime   Keep up good work.  Do not drive if sleepy  Work on healthy weight  Symbicort As needed    CT chest as planned , w/ Dr. Roxan Hockey.   Follow up with Dr. Halford Chessman  in 1 year and As needed

## 2022-02-08 NOTE — Assessment & Plan Note (Signed)
Excellent control and compliance on CPAP.  No residual events on current settings  Plan  Patient Instructions  Continue on CPAP At bedtime   Keep up good work.  Do not drive if sleepy  Work on healthy weight  Symbicort As needed    CT chest as planned , w/ Dr. Roxan Hockey.   Follow up with Dr. Halford Chessman  in 1 year and As needed

## 2022-02-08 NOTE — Assessment & Plan Note (Signed)
Scattered pulmonary nodules.  Continue serial follow-up with CT scan with Dr. Roxan Hockey

## 2022-02-08 NOTE — Progress Notes (Signed)
@Patient  ID: Dale Thompson, male    DOB: 23-Feb-1956, 66 y.o.   MRN: [id number]  Chief Complaint  Patient presents with   Follow-up    Referring provider: Deland Pretty, MD  HPI: 66 year old former smoker followed for obstructive sleep apnea, asthma, chronic allergies, nasal polyposis, emphysema.  History of lung nodules  TEST/EVENTS :  HST 11/23/21 >> AHI 43.6, SpO2 low 84%  02/08/2022 Follow up : OSA, Asthma/Emphysema  Patient presents for 2-monthfollow-up.  Patient was seen last visit for sleep consult to establish for sleep apnea.  Patient has longstanding history of sleep apnea.  His CPAP machine was very old.  And needed a new machine.  He required a sleep study.  He was set up for home sleep study on Nov 23, 2021 that showed severe sleep apnea with AHI 43.6/SPO2 low at 84%.  Patient was ordered a new CPAP machine.  Patient says he received this about 2 months ago.  Says it is working wImmunologist  Says it is very quiet and a very nice upgrade from his old machine.  Patient says he can never sleep without his CPAP.  Patient says he gets in about 8 to 9 hours.  Patient says he feels rested and feels that he benefits from CPAP.  CPAP download shows excellent compliance with 100% usage.  Daily average usage at 9 hours.  Patient is on auto CPAP 5 to 20 cm H2O.  AHI 0.5.  Minimal leaks.  Daily average pressure at 12 cm H2O.  Patient has a history of asthma and emphysema.  He is a former smoker.  Patient uses Symbicort as needed.  Says every once a while he will have to use Symbicort for a few days.  Says that overall his breathing is doing okay.  He denies any flare of cough or wheezing.  Patient says he is very active and exercises most days of the week about an hour.  Patient does have known pulmonary nodules on CT scan.  He is followed with serial CT by Dr. HRoxan Hockeywith thoracic surgery.  Has upcoming CT next month.  Patient denies any hemoptysis or unintentional weight loss.    No  Known Allergies  Immunization History  Administered Date(s) Administered   Fluad Quad(high Dose 65+) 04/17/2021   Influenza Whole 04/17/2017   Influenza-Unspecified 04/17/2018   PFIZER(Purple Top)SARS-COV-2 Vaccination 08/25/2019, 09/19/2019    Past Medical History:  Diagnosis Date   Asthma    Cancer ([place]    prostate, basal cell skin cancer   Coronary atherosclerosis    COVID-19 08/2021   Cro[place] disease ([place]    Gout    Hyperlipidemia    Nasal polyposis    OSA (obstructive sleep apnea)    Seasonal allergies     Tobacco History: Social History   Tobacco Use  Smoking Status Former   Packs/day: 1.00   Years: 22.00   Total pack years: 22.00   Types: Cigarettes   Quit date: 05/18/1997   Years since quitting: 24.7  Smokeless Tobacco Never   Counseling given: Not Answered   Outpatient Medications Prior to Visit  Medication Sig Dispense Refill   allopurinol (ZYLOPRIM) 100 MG tablet Take 200 mg by mouth daily.     aspirin 81 MG chewable tablet 1 tablet     b complex vitamins tablet Take 1 tablet by mouth daily.     budesonide (PULMICORT) 0.5 MG/2ML nebulizer solution Take 0.5 mg by nebulization as needed.  12   budesonide-formoterol (SYMBICORT)  80-4.5 MCG/ACT inhaler Take 2 puffs first thing in am and then another 2 puffs about 12 hours later. 1 Inhaler 11   Calcium Carbonate-Vitamin D (CALCIUM PLUS VITAMIN D PO) Take 1 tablet by mouth daily.     fexofenadine (ALLEGRA) 180 MG tablet Take 180 mg by mouth daily.     Flaxseed, Linseed, (FLAXSEED OIL PO) Take by mouth.     pantoprazole (PROTONIX) 40 MG tablet Take 40 mg by mouth daily.     rosuvastatin (CRESTOR) 20 MG tablet Take 20 mg by mouth at bedtime.     VITAMIN D PO Take by mouth.     No facility-administered medications prior to visit.     Review of Systems:   Constitutional:   No  weight loss, night sweats,  Fevers, chills, fatigue, or  lassitude.  HEENT:   No headaches,  Difficulty swallowing,  Tooth/dental  problems, or  Sore throat,                No sneezing, itching, ear ache, nasal congestion, post nasal drip,   CV:  No chest pain,  Orthopnea, PND, swelling in lower extremities, anasarca, dizziness, palpitations, syncope.   GI  No heartburn, indigestion, abdominal pain, nausea, vomiting, diarrhea, change in bowel habits, loss of appetite, bloody stools.   Resp: No shortness of breath with exertion or at rest.  No excess mucus, no productive cough,  No non-productive cough,  No coughing up of blood.  No change in color of mucus.  No wheezing.  No chest wall deformity  Skin: no rash or lesions.  GU: no dysuria, change in color of urine, no urgency or frequency.  No flank pain, no hematuria   MS:  No joint pain or swelling.  No decreased range of motion.  No back pain.    Physical Exam  BP 130/80 (BP Location: Left Arm, Cuff Size: Normal)   Pulse 85   Temp 98 F (36.7 C)   SpO2 99%   GEN: A/Ox3; pleasant , NAD, well nourished    HEENT:  [place]/AT,  NOSE-clear, THROAT-clear, no lesions, no postnasal drip or exudate noted. Class 2-3 MP airway   NECK:  Supple w/ fair ROM; no JVD; normal carotid impulses w/o bruits; no thyromegaly or nodules palpated; no lymphadenopathy.    RESP  Clear  P & A; w/o, wheezes/ rales/ or rhonchi. no accessory muscle use, no dullness to percussion  CARD:  RRR, no m/r/g, no peripheral edema, pulses intact, no cyanosis or clubbing.  GI:   Soft & nt; nml bowel sounds; no organomegaly or masses detected.   Musco: Warm bil, no deformities or joint swelling noted.   Neuro: alert, no focal deficits noted.    Skin: Warm, no lesions or rashes    Lab Results:    BNP No results found for: "BNP"  ProBNP No results found for: "PROBNP"  Imaging: No results found.        No data to display          No results found for: "NITRICOXIDE"      Assessment & Plan:   OSA (obstructive sleep apnea) Excellent control and compliance on CPAP.  No  residual events on current settings  Plan  Patient Instructions  Continue on CPAP At bedtime   Keep up good work.  Do not drive if sleepy  Work on healthy weight  Symbicort As needed    CT chest as planned , w/ Dr. Roxan Hockey.   Follow up with  Dr. Halford Chessman  in 1 year and As needed         Asthma Mild intermittent asthma.  Continue on Symbicort as needed.  Asthma action plan discussed.  Control for triggers.  Plan  Patient Instructions  Continue on CPAP At bedtime   Keep up good work.  Do not drive if sleepy  Work on healthy weight  Symbicort As needed    CT chest as planned , w/ Dr. Roxan Hockey.   Follow up with Dr. Halford Chessman  in 1 year and As needed         Solitary pulmonary nodule Scattered pulmonary nodules.  Continue serial follow-up with CT scan with Dr. Delsa Grana, NP 02/08/2022

## 2022-02-15 DIAGNOSIS — G4733 Obstructive sleep apnea (adult) (pediatric): Secondary | ICD-10-CM | POA: Diagnosis not present

## 2022-02-17 NOTE — Progress Notes (Signed)
Reviewed and agree with assessment/plan.   Chesley Mires, MD [facility] Pulmonary/Critical Care 02/17/2022, 9:58 AM Pager:  [phone]

## 2022-03-15 ENCOUNTER — Ambulatory Visit
Admission: RE | Admit: 2022-03-15 | Discharge: 2022-03-15 | Disposition: A | Discharge status: Home / Self Care | Payer: Medicare HMO | Source: Ambulatory Visit | Attending: Thoracic Surgery (Cardiothoracic Vascular Surgery) | Admitting: Thoracic Surgery (Cardiothoracic Vascular Surgery)

## 2022-03-15 DIAGNOSIS — I700 Atherosclerosis of aorta: Secondary | ICD-10-CM | POA: Diagnosis not present

## 2022-03-15 DIAGNOSIS — R918 Other nonspecific abnormal finding of lung field: Secondary | ICD-10-CM

## 2022-03-15 DIAGNOSIS — R911 Solitary pulmonary nodule: Secondary | ICD-10-CM | POA: Diagnosis not present

## 2022-03-17 ENCOUNTER — Other Ambulatory Visit: Payer: Medicare HMO

## 2022-03-18 DIAGNOSIS — G4733 Obstructive sleep apnea (adult) (pediatric): Secondary | ICD-10-CM | POA: Diagnosis not present

## 2022-03-22 ENCOUNTER — Ambulatory Visit: Payer: Medicare HMO | Admitting: Thoracic Surgery (Cardiothoracic Vascular Surgery)

## 2022-03-22 ENCOUNTER — Encounter: Payer: Self-pay | Admitting: Thoracic Surgery (Cardiothoracic Vascular Surgery)

## 2022-03-22 VITALS — BP 127/80 | HR 88 | Resp 20 | Ht 68.0 in | Wt 188.2 lb

## 2022-03-22 DIAGNOSIS — R911 Solitary pulmonary nodule: Secondary | ICD-10-CM | POA: Diagnosis not present

## 2022-03-22 NOTE — Progress Notes (Signed)
[address]       [place] [address]             [phone]    HPI: Dale Thompson returns for a scheduled follow-up visit regarding lung nodules.  Dale Thompson is a 66 year old man with a history of hyperlipidemia, prostate cancer, asthma, gout, Crohn's disease, remote tobacco abuse, aberrant right subclavian artery, aortic atherosclerosis, coronary atherosclerosis, and lung nodules.  He had a 22-pack-year history of smoking.  He quit smoking in 1998.  He was found to have bilateral groundglass opacities in 2018.  In December 2020 left upper lobe nodule showed an increase in size.  Navigational bronchoscopy was done in April 2021.  Showed inflammation and scarring.  No evidence of malignancy.  He is continued with radiographic follow-up.  I last saw him in December 2022.  Shortly after that he had COVID.  Since then he has been feeling well.  Past Medical History:  Diagnosis Date   Asthma    Cancer ([place])    prostate, basal cell skin cancer   Coronary atherosclerosis    COVID-19 08/2021   Crohn disease ([place])    Gout    Hyperlipidemia    Nasal polyposis    OSA (obstructive sleep apnea)    Seasonal allergies     Current Outpatient Medications  Medication Sig Dispense Refill   allopurinol (ZYLOPRIM) 100 MG tablet Take 200 mg by mouth daily.     aspirin 81 MG chewable tablet 1 tablet     b complex vitamins tablet Take 1 tablet by mouth daily.     budesonide (PULMICORT) 0.5 MG/2ML nebulizer solution Take 0.5 mg by nebulization as needed.  12   budesonide-formoterol (SYMBICORT) 80-4.5 MCG/ACT inhaler Take 2 puffs first thing in am and then another 2 puffs about 12 hours later. 1 Inhaler 11   Calcium Carbonate-Vitamin D (CALCIUM PLUS VITAMIN D PO) Take 1 tablet by mouth daily.     fexofenadine (ALLEGRA) 180 MG tablet Take 180 mg by mouth daily.     Flaxseed, Linseed, (FLAXSEED OIL PO) Take by mouth.     pantoprazole (PROTONIX) 40 MG tablet Take 40 mg by mouth  daily.     rosuvastatin (CRESTOR) 20 MG tablet Take 20 mg by mouth at bedtime.     VITAMIN D PO Take by mouth.     No current facility-administered medications for this visit.    Physical Exam BP 127/80 (BP Location: Left Arm, Patient Position: Sitting, Cuff Size: Large)   Pulse 88   Resp 20   Ht 5' 8"  (1.727 m)   Wt 188 lb 3.2 oz (85.4 kg)   SpO2 94% Comment: RA  BMI 28.69 kg/m  65 year old man in no acute distress Alert and oriented x3 with no focal deficits Lungs clear with equal breath sounds bilaterally No cervical or supraclavicular adenopathy Cardiac regular rhythm No carotid bruits  Diagnostic Tests: CT CHEST WITHOUT CONTRAST   TECHNIQUE: Multidetector CT imaging of the chest was performed following the standard protocol without IV contrast.   RADIATION DOSE REDUCTION: This exam was performed according to the departmental dose-optimization program which includes automated exposure control, adjustment of the mA and/or kV according to patient size and/or use of iterative reconstruction technique.   COMPARISON:  06/29/2021   FINDINGS: Cardiovascular: Aortic athero sclerosis. Incidental note aberrant retroesophageal origin of the right subclavian artery. Normal heart size. Left coronary artery calcifications. No pericardial effusion.   Mediastinum/Nodes: No enlarged  mediastinal, hilar, or axillary lymph nodes. Thyroid gland, trachea, and esophagus demonstrate no significant findings.   Lungs/Pleura: Unchanged subsolid nodule of the central left upper lobe, measuring 1.6 x 1.5 cm (series 8, image 36). Unchanged, predominantly bandlike opacities in the superior segment right lower lobe and peripheral right upper lobe, consistent with scarring (series 8, image 72). Interval resolution of previously seen clustered centrilobular and tree-in-bud nodules in the posterior right upper lobe. Persistent background of very fine centrilobular nodules, concentrated in the  lung apices. Diffuse bilateral bronchial wall thickening. No pleural effusion or pneumothorax.   Upper Abdomen: No acute abnormality.  Hepatic steatosis.   Musculoskeletal: No chest wall abnormality. No acute osseous findings.   IMPRESSION: 1. Unchanged subsolid nodule of the central left upper lobe, measuring 1.6 x 1.5 cm. While continued stability is reassuring, this remains morphologically suspicious for indolent adenocarcinoma and continued CT surveillance 6-12 months is advised. 2. Additional unchanged, predominantly bandlike opacities in the superior segment right lower lobe and peripheral right upper lobe, consistent with scarring. 3. Interval resolution of previously seen clustered centrilobular and tree-in-bud nodules in the posterior right upper lobe, consistent with resolution of nonspecific infection or inflammation. 4. Persistent background of very fine centrilobular nodules and diffuse bilateral bronchial wall thickening, consistent with smoking-related respiratory bronchiolitis. 5. Coronary artery disease.   Aortic Atherosclerosis (ICD10-I70.0).     Electronically Signed   By: Delanna Ahmadi M.D.   On: 03/15/2022 10:05   I personally reviewed the CT images.  Left upper lobe nodule unchanged.  Resolution of tree-in-bud nodules in the right upper lobe.  Other areas of scarring present.  Impression: Dale Thompson is a 66 year old man with a history of hyperlipidemia, prostate cancer, asthma, gout, Crohn's disease, remote tobacco abuse, aberrant right subclavian artery, aortic atherosclerosis, coronary atherosclerosis, and lung nodules.   Left upper lobe groundglass opacity -previous biopsy in 2021 showed fibrosis and scarring and no malignancy.  We still need to follow-up.  Because of the possible there could been some scarring in the vicinity of cancer.  No interval change so I think we can safely continue with radiographic follow-up.  There is no indication for  rebiopsy at this point.  Another alternative would be resection but this is very central and would require a major resection.  I do not see anything to justify the risk associated with that.  Tobacco use-quit smoking in 1998.  Plan: Return in 6 months with CT chest  Melrose Nakayama, MD [facility] [phone]

## 2022-03-31 DIAGNOSIS — C61 Malignant neoplasm of prostate: Secondary | ICD-10-CM | POA: Diagnosis not present

## 2022-04-08 DIAGNOSIS — E291 Testicular hypofunction: Secondary | ICD-10-CM | POA: Diagnosis not present

## 2022-04-08 DIAGNOSIS — C61 Malignant neoplasm of prostate: Secondary | ICD-10-CM | POA: Diagnosis not present

## 2022-04-08 DIAGNOSIS — N5201 Erectile dysfunction due to arterial insufficiency: Secondary | ICD-10-CM | POA: Diagnosis not present

## 2022-04-17 DIAGNOSIS — G4733 Obstructive sleep apnea (adult) (pediatric): Secondary | ICD-10-CM | POA: Diagnosis not present

## 2022-05-18 DIAGNOSIS — G4733 Obstructive sleep apnea (adult) (pediatric): Secondary | ICD-10-CM | POA: Diagnosis not present

## 2022-05-24 DIAGNOSIS — J439 Emphysema, unspecified: Secondary | ICD-10-CM | POA: Diagnosis not present

## 2022-05-24 DIAGNOSIS — L42 Pityriasis rosea: Secondary | ICD-10-CM | POA: Diagnosis not present

## 2022-05-24 DIAGNOSIS — L309 Dermatitis, unspecified: Secondary | ICD-10-CM | POA: Diagnosis not present

## 2022-05-24 DIAGNOSIS — G4733 Obstructive sleep apnea (adult) (pediatric): Secondary | ICD-10-CM | POA: Diagnosis not present

## 2022-05-24 DIAGNOSIS — J302 Other seasonal allergic rhinitis: Secondary | ICD-10-CM | POA: Diagnosis not present

## 2022-05-24 DIAGNOSIS — J8410 Pulmonary fibrosis, unspecified: Secondary | ICD-10-CM | POA: Diagnosis not present

## 2022-05-24 DIAGNOSIS — E785 Hyperlipidemia, unspecified: Secondary | ICD-10-CM | POA: Diagnosis not present

## 2022-05-24 DIAGNOSIS — I2510 Atherosclerotic heart disease of native coronary artery without angina pectoris: Secondary | ICD-10-CM | POA: Diagnosis not present

## 2022-05-24 DIAGNOSIS — K219 Gastro-esophageal reflux disease without esophagitis: Secondary | ICD-10-CM | POA: Diagnosis not present

## 2022-05-24 DIAGNOSIS — M109 Gout, unspecified: Secondary | ICD-10-CM | POA: Diagnosis not present

## 2022-05-24 DIAGNOSIS — I700 Atherosclerosis of aorta: Secondary | ICD-10-CM | POA: Diagnosis not present

## 2022-05-24 DIAGNOSIS — J479 Bronchiectasis, uncomplicated: Secondary | ICD-10-CM | POA: Diagnosis not present

## 2022-06-17 DIAGNOSIS — G4733 Obstructive sleep apnea (adult) (pediatric): Secondary | ICD-10-CM | POA: Diagnosis not present

## 2022-07-14 DIAGNOSIS — G4733 Obstructive sleep apnea (adult) (pediatric): Secondary | ICD-10-CM | POA: Diagnosis not present

## 2022-07-18 DIAGNOSIS — G4733 Obstructive sleep apnea (adult) (pediatric): Secondary | ICD-10-CM | POA: Diagnosis not present

## 2022-08-02 DIAGNOSIS — Z125 Encounter for screening for malignant neoplasm of prostate: Secondary | ICD-10-CM | POA: Diagnosis not present

## 2022-08-02 DIAGNOSIS — M810 Age-related osteoporosis without current pathological fracture: Secondary | ICD-10-CM | POA: Diagnosis not present

## 2022-08-02 DIAGNOSIS — E785 Hyperlipidemia, unspecified: Secondary | ICD-10-CM | POA: Diagnosis not present

## 2022-08-05 DIAGNOSIS — C61 Malignant neoplasm of prostate: Secondary | ICD-10-CM | POA: Diagnosis not present

## 2022-08-05 DIAGNOSIS — M810 Age-related osteoporosis without current pathological fracture: Secondary | ICD-10-CM | POA: Diagnosis not present

## 2022-08-05 DIAGNOSIS — I700 Atherosclerosis of aorta: Secondary | ICD-10-CM | POA: Diagnosis not present

## 2022-08-05 DIAGNOSIS — Z23 Encounter for immunization: Secondary | ICD-10-CM | POA: Diagnosis not present

## 2022-08-05 DIAGNOSIS — M1A9XX0 Chronic gout, unspecified, without tophus (tophi): Secondary | ICD-10-CM | POA: Diagnosis not present

## 2022-08-05 DIAGNOSIS — Z0000 Encounter for general adult medical examination without abnormal findings: Secondary | ICD-10-CM | POA: Diagnosis not present

## 2022-08-05 DIAGNOSIS — K219 Gastro-esophageal reflux disease without esophagitis: Secondary | ICD-10-CM | POA: Diagnosis not present

## 2022-08-05 DIAGNOSIS — I2510 Atherosclerotic heart disease of native coronary artery without angina pectoris: Secondary | ICD-10-CM | POA: Diagnosis not present

## 2022-08-05 DIAGNOSIS — E789 Disorder of lipoprotein metabolism, unspecified: Secondary | ICD-10-CM | POA: Diagnosis not present

## 2022-08-05 DIAGNOSIS — R911 Solitary pulmonary nodule: Secondary | ICD-10-CM | POA: Diagnosis not present

## 2022-08-05 DIAGNOSIS — R140 Abdominal distension (gaseous): Secondary | ICD-10-CM | POA: Diagnosis not present

## 2022-08-17 ENCOUNTER — Other Ambulatory Visit: Payer: Self-pay | Admitting: Thoracic Surgery (Cardiothoracic Vascular Surgery)

## 2022-08-17 DIAGNOSIS — R911 Solitary pulmonary nodule: Secondary | ICD-10-CM

## 2022-08-18 DIAGNOSIS — G4733 Obstructive sleep apnea (adult) (pediatric): Secondary | ICD-10-CM | POA: Diagnosis not present

## 2022-09-16 DIAGNOSIS — G4733 Obstructive sleep apnea (adult) (pediatric): Secondary | ICD-10-CM | POA: Diagnosis not present

## 2022-09-22 ENCOUNTER — Ambulatory Visit
Admission: RE | Admit: 2022-09-22 | Discharge: 2022-09-22 | Disposition: A | Discharge status: Home / Self Care | Payer: Medicare HMO | Source: Ambulatory Visit | Attending: Thoracic Surgery (Cardiothoracic Vascular Surgery) | Admitting: Thoracic Surgery (Cardiothoracic Vascular Surgery)

## 2022-09-22 DIAGNOSIS — R911 Solitary pulmonary nodule: Secondary | ICD-10-CM | POA: Diagnosis not present

## 2022-09-22 DIAGNOSIS — I700 Atherosclerosis of aorta: Secondary | ICD-10-CM | POA: Diagnosis not present

## 2022-09-27 ENCOUNTER — Encounter: Payer: Self-pay | Admitting: Thoracic Surgery (Cardiothoracic Vascular Surgery)

## 2022-09-27 ENCOUNTER — Ambulatory Visit: Payer: Medicare HMO | Admitting: Thoracic Surgery (Cardiothoracic Vascular Surgery)

## 2022-09-27 VITALS — BP 169/77 | HR 79 | Resp 20 | Ht 68.0 in | Wt 179.0 lb

## 2022-09-27 DIAGNOSIS — R918 Other nonspecific abnormal finding of lung field: Secondary | ICD-10-CM

## 2022-09-27 NOTE — Progress Notes (Signed)
[address]       [place] [address]             [phone]     HPI: Mr. Dale Thompson returns for follow-up of lung nodules.  Dale Thompson is a 67 year old male with a history of hyperlipidemia, prostate cancer, asthma, gout, Crohn's disease, remote tobacco abuse, aberrant right subclavian artery, aortic atherosclerosis, coronary atherosclerosis, and multiple groundglass lung nodules.  He had a 22-pack-year history of smoking prior to quitting in 1998.  He was found to have bilateral groundglass opacities back in 2018.  In December 2022 the left upper lobe nodule showed an increase in size.  Navigational bronchoscopy showed inflammation and scarring, but no evidence of malignancy.  We have continued to follow him since then.  I last saw him in September 2023.  His CT showed no change in the left upper lobe nodule.  In the interim since his last visit he has been feeling well.  He exercises regularly.  He does about 2 hours a day on elliptical machine.  No chest pain, pressure, tightness, or shortness of breath.  Past Medical History:  Diagnosis Date   Asthma    Cancer ([place])    prostate, basal cell skin cancer   Coronary atherosclerosis    COVID-19 08/2021   Crohn disease ([place])    Gout    Hyperlipidemia    Nasal polyposis    OSA (obstructive sleep apnea)    Seasonal allergies     Current Outpatient Medications  Medication Sig Dispense Refill   allopurinol (ZYLOPRIM) 100 MG tablet Take 200 mg by mouth daily.     aspirin 81 MG chewable tablet 1 tablet     b complex vitamins tablet Take 1 tablet by mouth daily.     budesonide (PULMICORT) 0.5 MG/2ML nebulizer solution Take 0.5 mg by nebulization as needed.  12   budesonide-formoterol (SYMBICORT) 80-4.5 MCG/ACT inhaler Take 2 puffs first thing in am and then another 2 puffs about 12 hours later. 1 Inhaler 11   Calcium Carbonate-Vitamin D (CALCIUM PLUS VITAMIN D PO) Take 1 tablet by mouth daily.      fexofenadine (ALLEGRA) 180 MG tablet Take 180 mg by mouth daily.     Flaxseed, Linseed, (FLAXSEED OIL PO) Take by mouth.     pantoprazole (PROTONIX) 40 MG tablet Take 40 mg by mouth daily.     rosuvastatin (CRESTOR) 20 MG tablet Take 20 mg by mouth at bedtime.     VITAMIN D PO Take by mouth.     No current facility-administered medications for this visit.    Physical Exam BP (!) 169/77   Pulse 79   Resp 20   Ht '5\' 8"'$  (1.727 m)   Wt 179 lb (81.2 kg)   SpO2 96% Comment: RA  BMI 27.22 kg/m  67 year old man in no acute distress Alert and oriented x 3 with no focal deficits Lungs clear, no rales or wheezing Cardiac regular rate and rhythm, no murmur No cervical or subclavicular adenopathy, no carotid bruits No clubbing cyanosis or edema  Diagnostic Tests: CT CHEST WITHOUT CONTRAST   TECHNIQUE: Multidetector CT imaging of the chest was performed following the standard protocol without IV contrast.   RADIATION DOSE REDUCTION: This exam was performed according to the departmental dose-optimization program which includes automated exposure control, adjustment of the mA and/or kV according to patient size and/or use of iterative reconstruction technique.   COMPARISON:  Multiple priors, most recent dated  March 15, 2022; chest CT dated July 16, 2019   FINDINGS: Cardiovascular: Normal heart size. No pericardial effusion. Normal caliber thoracic aorta with moderate atherosclerotic disease. Severe coronary artery calcifications.   Mediastinum/Nodes: Esophagus and thyroid are unremarkable. No pathologically enlarged lymph nodes seen in the chest.   Lungs/Pleura: Central airways are patent. Biapical pleural-parenchymal scarring. Part solid nodule of the left upper lobe measuring 16 x 15 mm in overall diameter with 4 mm solid component on series 3, image 34, nodule is unchanged when compared with most recent prior exam, although solid component is new when compared with remote  priors, for example chest CT dated July 15, 2019. Additional subtle ground-glass nodule of the left upper lobe measuring 11 x 7 mm on series 3, image 48 stable when compared with prior exams dating back to July 15, 2019. Additional linear nodular opacities are seen in the bilateral lower lobes and right upper lobe and are unchanged when compared with 2020 prior. Reference nodule in the right middle lobe measuring 8 mm on series 3, image 55 and reference nodule in the right lower lobe measuring 12 x 4 mm. No pleural effusion or pneumothorax   Upper Abdomen: Hepatic steatosis and diverticulosis.   Musculoskeletal: No chest wall mass or suspicious bone lesions identified.   IMPRESSION: 1. Part solid nodule of the left upper lobe is unchanged when compared with prior exam, but demonstrates new small solid component when compared with more remote priors and is suspicious for indolent primary lung adenocarcinoma. Recommend continued yearly CT surveillance. 2. Additional subtle ground-glass nodule left upper lobe stable when compared with multiple prior exams. Recommend attention on follow-up. 3. Stable linear nodular opacities of the bilateral lower lobes and right upper lobe, likely due to scarring. Recommend attention on follow-up. 4. Severe coronary artery calcifications and aortic Atherosclerosis (ICD10-I70.0).     Electronically Signed   By: Yetta Glassman M.D.   On: 09/22/2022 10:00 I personally reviewed the CT images and compared it to his previous films from 2023, 2022 and 2021.  Stable subsolid nodule left upper lobe with minimal solid component.  Aortic and coronary artery calcifications.  Impression: Dale Thompson is a 67 year old male with a history of hyperlipidemia, prostate cancer, asthma, gout, Crohn's disease, remote tobacco abuse, aberrant right subclavian artery, aortic atherosclerosis, coronary atherosclerosis, and multiple groundglass lung  nodules.  Left upper lobe subsolid nodule-remains stable compared to a scan from 6 months ago.  Even going back to 2021 there is no dramatic change.  Radiology did note change going way back.  Has previously been biopsied and was inflammatory.  He understands that a negative biopsy does not rule out the possibility that this is a low-grade adenocarcinoma.  We again reviewed the options of continued radiographic follow-up, navigational bronchoscopy for biopsy, or wedge resection for definitive diagnosis and treatment.  He understands the advantages and disadvantages of each approach and wishes to continue with radiographic follow-up for now.  Coronary atherosclerosis-he was concerned because the report noted severe coronary atherosclerosis.  He does have quite substantial calcification.  He is followed by Dr. Gwenlyn Found.  He is on a statin.  He is not having any symptoms.  He has a follow-up appointment with Dr. Gwenlyn Found coming up soon.  Plan: Follow-up with Dr. Gwenlyn Found as scheduled Return in 6 months with CT chest  Melrose Nakayama, MD [facility] [phone]

## 2022-09-28 NOTE — Progress Notes (Signed)
Cardiology Clinic Note   Patient Name: Dale Thompson Date of Encounter: 09/30/2022  Primary Care Provider:  Deland Pretty, MD Primary Cardiologist:  Dr. 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  He was completely asymptomatic.  He is followed by Dr. Roxan Hockey concerning pulmonary nodule.  Past Medical History    Past Medical History:  Diagnosis Date   Asthma    Cancer ([place])    prostate, basal cell skin cancer   Coronary atherosclerosis    COVID-19 08/2021   Crohn disease ([place])    Gout    Hyperlipidemia    Nasal polyposis    OSA (obstructive sleep apnea)    Seasonal allergies    Past Surgical History:  Procedure Laterality Date   COLON SURGERY  1979   VIDEO BRONCHOSCOPY WITH ENDOBRONCHIAL NAVIGATION N/A 10/17/2019   Procedure: VIDEO BRONCHOSCOPY WITH ENDOBRONCHIAL NAVIGATION;  Surgeon: Melrose Nakayama, MD;  Location: [place];  Service: Thoracic;  Laterality: N/A;    Allergies  No Known Allergies  History of Present Illness    Dale Thompson returns today for ongoing assessment and management of hyperlipidemia, history of coronary calcification seen on chest CT on 07/09/2021, found in the LAD and circumflex with 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  He was seen by Dr. Roxan Hockey yesterday and was stable.  No progression of pulmonary nodule or coronary calcium on review of CT scan.  Mr. Criste is very active, works out on an elliptical 1 hour every day.  He is medically compliant, is watching his diet.  He has had recent labs from Dr. Shelia Media, which he states showed that his cholesterol levels were very  well-controlled.  He offers no complaints of chest pain, significant dyspnea on exertion, or new symptoms of abdominal pain or dizziness.  He is overall adhering to a healthy lifestyle.  Home Medications    Current Outpatient Medications  Medication Sig Dispense Refill   allopurinol (ZYLOPRIM) 100 MG tablet Take 200 mg by mouth daily.     aspirin 81 MG chewable tablet 1 tablet     b complex vitamins tablet Take 1 tablet by mouth daily.     betamethasone dipropionate (DIPROLENE) 0.05 % ointment Apply 1 Application topically as needed (legs).     budesonide (PULMICORT) 0.5 MG/2ML nebulizer solution Take 0.5 mg by nebulization as needed.  12   budesonide-formoterol (SYMBICORT) 80-4.5 MCG/ACT inhaler Take 2 puffs first thing in am and then another 2 puffs about 12 hours later. (Patient taking differently: as needed (allergies, sob). Take 2 puffs first thing in am and then another 2 puffs about 12 hours later.) 1 Inhaler 11   Calcium Carbonate-Vitamin D (CALCIUM PLUS VITAMIN D PO) Take 1 tablet by mouth daily.     ciclopirox (LOPROX) 0.77 % SUSP as needed (breakouts).     Colchicine (MITIGARE) 0.6 MG CAPS as needed (gout flares).     fexofenadine (ALLEGRA) 180 MG tablet Take 180 mg by mouth daily.     pantoprazole (PROTONIX) 40 MG tablet Take 40 mg by mouth daily.     rosuvastatin (CRESTOR) 20 MG tablet Take 20 mg by mouth at bedtime.  VITAMIN D PO Take by mouth.     No current facility-administered medications for this visit.     Family History    Family History  Problem Relation Age of Onset   Heart disease Maternal Grandfather        MI at a young age   He indicated that the status of his maternal grandfather is unknown.  Social History    Social History   Socioeconomic History   Marital status: Married    Spouse name: Not on file   Number of children: 2   Years of education: Not on file   Highest education level: Not on file  Occupational History   Occupation: Executive   Tobacco Use   Smoking status: Former    Packs/day: 1.00    Years: 22.00    Additional pack years: 0.00    Total pack years: 22.00    Types: Cigarettes    Quit date: 05/18/1997    Years since quitting: 25.3   Smokeless tobacco: Never  Vaping Use   Vaping Use: Never used  Substance and Sexual Activity   Alcohol use: Yes    Comment: 12 beers per wk   Drug use: No   Sexual activity: Not on file  Other Topics Concern   Not on file  Social History Narrative   Not on file   Social Determinants of Health   Financial Resource Strain: Not on file  Food Insecurity: Not on file  Transportation Needs: Not on file  Physical Activity: Not on file  Stress: Not on file  Social Connections: Not on file  Intimate Partner Violence: Not on file     Review of Systems    General:  No chills, fever, night sweats or weight changes.  Cardiovascular:  No chest pain, dyspnea on exertion, edema, orthopnea, palpitations, paroxysmal nocturnal dyspnea. Dermatological: No rash, lesions/masses Respiratory: No cough, dyspnea Urologic: No hematuria, dysuria Abdominal:   No nausea, vomiting, diarrhea, bright red blood per rectum, melena, or hematemesis Neurologic:  No visual changes, wkns, changes in mental status. All other systems reviewed and are otherwise negative except as noted above.     Physical Exam    VS:  BP 130/78   Pulse 80   Ht 5\' 7"  (1.702 m)   Wt 176 lb (79.8 kg)   SpO2 96%   BMI 27.57 kg/m  , BMI Body mass index is 27.57 kg/m.     GEN: Well nourished, well developed, in no acute distress. HEENT: normal. Neck: Supple, no JVD, carotid bruits, or masses. Cardiac: RRR, no murmurs, rubs, or gallops. No clubbing, cyanosis, edema.  Radials/DP/PT 2+ and equal bilaterally.  Respiratory:  Respirations regular and unlabored, clear to auscultation bilaterally. GI: Soft, nontender, nondistended, BS + x 4. MS: no deformity or atrophy. Skin: warm and dry, no rash. Neuro:  Strength and  sensation are intact. Psych: Normal affect.  Accessory Clinical Findings    ECG personally reviewed by me today-normal sinus rhythm, heart rate of 80 bpm, no EKG changes indicative of ischemia.- No acute changes  Lab Results  Component Value Date   WBC 7.1 10/15/2019   HGB 15.0 10/15/2019   HCT 44.5 10/15/2019   MCV 94.1 10/15/2019   PLT 234 10/15/2019   Lab Results  Component Value Date   CREATININE 0.76 10/15/2019   BUN 11 10/15/2019   NA 136 10/15/2019   K 4.3 10/15/2019   CL 99 10/15/2019   CO2 27 10/15/2019   Lab Results  Component Value Date   ALT 81 (H) 10/15/2019   AST 58 (H) 10/15/2019   ALKPHOS 65 10/15/2019   BILITOT 0.7 10/15/2019   No results found for: "CHOL", "HDL", "LDLCALC", "LDLDIRECT", "TRIG", "CHOLHDL"  No results found for: "HGBA1C"  Review of Prior Studies: CT of the Chest without contrast 09/22/2022. 1. Part solid nodule of the left upper lobe is unchanged when compared with prior exam, but demonstrates new small solid component when compared with more remote priors and is suspicious for indolent primary lung adenocarcinoma. Recommend continued yearly CT surveillance. 2. Additional subtle ground-glass nodule left upper lobe stable when compared with multiple prior exams. Recommend attention on follow-up. 3. Stable linear nodular opacities of the bilateral lower lobes and right upper lobe, likely due to scarring. Recommend attention on follow-up. 4. Severe coronary artery calcifications and aortic Atherosclerosis (ICD10-I70.0).  Coronary Calcium Score 10/02/2020  1. Coronary calcium score of 552. This was 84th percentile for age, gender, and race matched controls.   2. Aortic root and descending aortic atherosclerosis noted.  Assessment & Plan   1.  Coronary artery calcifications: Coronary calcium score on 10/02/2020 revealed a score of 552.  On 09/22/2022 CT scan did reveal severe coronary artery calcifications.  He is now on statin therapy,  is very active exercising daily, and is completely without symptoms.  He should continue his healthy lifestyle, medication regimen, and be followed in 1 year.    I do not feel that he needs any further testing or new ischemic testing as he is so physically active and asymptomatic.  However, we will be happy to see him and have follow-up CTA with [facility] should he become symptomatic.  I will request labs from his primary care provider so that we can have up-to-date information.  2.  Hypercholesterolemia: Remains on statin therapy with rosuvastatin 20 mg at bedtime.  Has rare cramping in his legs which are relieved by stretching.  Requesting recent labs for our documentation.  3.  Pulmonary nodule: Followed by Dr. Roxan Hockey.  There was no worsening in size and is being followed periodically.  Addendum completed to correct dictation errors.   Current medicines are reviewed at length with the patient today.  I have spent 25 min's  dedicated to the care of this patient on the date of this encounter to include pre-visit review of records, assessment, management and diagnostic testing,with shared decision making. Signed, Phill Myron. West Pugh, ANP, [place]   09/30/2022 12:49 PM      Office [phone] Fax [phone]  Notice: This dictation was prepared with Dragon dictation along with smaller phrase technology. Any transcriptional errors that result from this process are unintentional and may not be corrected upon review.

## 2022-09-30 ENCOUNTER — Ambulatory Visit: Payer: Medicare HMO | Attending: Adult Health | Admitting: Adult Health

## 2022-09-30 ENCOUNTER — Encounter: Payer: Self-pay | Admitting: Adult Health

## 2022-09-30 VITALS — BP 130/78 | HR 80 | Ht 67.0 in | Wt 176.0 lb

## 2022-09-30 DIAGNOSIS — E782 Mixed hyperlipidemia: Secondary | ICD-10-CM

## 2022-09-30 NOTE — Patient Instructions (Signed)
Medication Instructions:  No Changes *If you need a refill on your cardiac medications before your next appointment, please call your pharmacy*   Lab Work: No Labs If you have labs (blood work) drawn today and your tests are completely normal, you will receive your results only by: MyChart Message (if you have MyChart) OR A paper copy in the mail If you have any lab test that is abnormal or we need to change your treatment, we will call you to review the results.   Testing/Procedures: No Testing   Follow-Up: At [facility], you and your health needs are our priority.  As part of our continuing mission to provide you with exceptional heart care, we have created designated Provider Care Teams.  These Care Teams include your primary Cardiologist (physician) and Advanced Practice Providers (APPs -  Physician Assistants and Nurse Practitioners) who all work together to provide you with the care you need, when you need it.  We recommend signing up for the patient portal called "MyChart".  Sign up information is provided on this After Visit Summary.  MyChart is used to connect with patients for Virtual Visits (Telemedicine).  Patients are able to view lab/test results, encounter notes, upcoming appointments, etc.  Non-urgent messages can be sent to your provider as well.   To learn more about what you can do with MyChart, go to [email or web address].    Your next appointment:   1 year(s)  Provider:   Jonathan Berry, MD     

## 2022-10-13 DIAGNOSIS — G4733 Obstructive sleep apnea (adult) (pediatric): Secondary | ICD-10-CM | POA: Diagnosis not present

## 2022-10-17 DIAGNOSIS — G4733 Obstructive sleep apnea (adult) (pediatric): Secondary | ICD-10-CM | POA: Diagnosis not present

## 2022-11-08 DIAGNOSIS — C61 Malignant neoplasm of prostate: Secondary | ICD-10-CM | POA: Diagnosis not present

## 2022-11-15 DIAGNOSIS — N5201 Erectile dysfunction due to arterial insufficiency: Secondary | ICD-10-CM | POA: Diagnosis not present

## 2022-11-15 DIAGNOSIS — C61 Malignant neoplasm of prostate: Secondary | ICD-10-CM | POA: Diagnosis not present

## 2022-11-16 DIAGNOSIS — G4733 Obstructive sleep apnea (adult) (pediatric): Secondary | ICD-10-CM | POA: Diagnosis not present

## 2022-11-23 DIAGNOSIS — J310 Chronic rhinitis: Secondary | ICD-10-CM | POA: Diagnosis not present

## 2022-12-17 DIAGNOSIS — G4733 Obstructive sleep apnea (adult) (pediatric): Secondary | ICD-10-CM | POA: Diagnosis not present

## 2023-02-16 ENCOUNTER — Other Ambulatory Visit: Payer: Self-pay | Admitting: Urology

## 2023-02-16 DIAGNOSIS — C61 Malignant neoplasm of prostate: Secondary | ICD-10-CM

## 2023-02-28 ENCOUNTER — Other Ambulatory Visit: Payer: Self-pay | Admitting: Thoracic Surgery (Cardiothoracic Vascular Surgery)

## 2023-02-28 DIAGNOSIS — R918 Other nonspecific abnormal finding of lung field: Secondary | ICD-10-CM

## 2023-03-06 ENCOUNTER — Encounter (HOSPITAL_BASED_OUTPATIENT_CLINIC_OR_DEPARTMENT_OTHER): Payer: Self-pay | Admitting: Pulmonary Disease

## 2023-03-06 DIAGNOSIS — G4733 Obstructive sleep apnea (adult) (pediatric): Secondary | ICD-10-CM

## 2023-03-07 NOTE — Telephone Encounter (Signed)
Pt called in about getting his CPAP supplies

## 2023-03-13 ENCOUNTER — Telehealth: Payer: Self-pay | Admitting: Pulmonary Disease

## 2023-03-13 NOTE — Telephone Encounter (Signed)
Dale Thompson from [facility] is calling . CMN-certificate of medical necessary is needed to be signed and returned. fax number [phone]

## 2023-03-15 NOTE — Telephone Encounter (Signed)
I just checked [employer] and nothing for this patient

## 2023-03-15 NOTE — Telephone Encounter (Signed)
PCC's is there a cmn available on this patient?

## 2023-03-15 NOTE — Telephone Encounter (Signed)
If this was faxed in to [facility] it would have been forwarded to [facility] and Lillia Abed up front gets these signed and faxes them back

## 2023-03-15 NOTE — Telephone Encounter (Signed)
Patient was last seen 02/08/2022 and will need to have office visit

## 2023-03-21 ENCOUNTER — Encounter: Payer: Self-pay | Admitting: Urology

## 2023-03-31 ENCOUNTER — Ambulatory Visit
Admission: RE | Admit: 2023-03-31 | Discharge: 2023-03-31 | Disposition: A | Discharge status: Home / Self Care | Payer: Medicare HMO | Source: Ambulatory Visit | Attending: Thoracic Surgery (Cardiothoracic Vascular Surgery) | Admitting: Thoracic Surgery (Cardiothoracic Vascular Surgery)

## 2023-03-31 DIAGNOSIS — K760 Fatty (change of) liver, not elsewhere classified: Secondary | ICD-10-CM | POA: Diagnosis not present

## 2023-03-31 DIAGNOSIS — I700 Atherosclerosis of aorta: Secondary | ICD-10-CM | POA: Diagnosis not present

## 2023-03-31 DIAGNOSIS — R918 Other nonspecific abnormal finding of lung field: Secondary | ICD-10-CM | POA: Diagnosis not present

## 2023-03-31 DIAGNOSIS — Z8546 Personal history of malignant neoplasm of prostate: Secondary | ICD-10-CM | POA: Diagnosis not present

## 2023-04-04 ENCOUNTER — Ambulatory Visit: Payer: Medicare HMO | Admitting: Thoracic Surgery (Cardiothoracic Vascular Surgery)

## 2023-04-05 ENCOUNTER — Ambulatory Visit (HOSPITAL_BASED_OUTPATIENT_CLINIC_OR_DEPARTMENT_OTHER): Payer: Medicare HMO | Admitting: Pulmonary Disease

## 2023-04-05 ENCOUNTER — Encounter (HOSPITAL_BASED_OUTPATIENT_CLINIC_OR_DEPARTMENT_OTHER): Payer: Self-pay | Admitting: Pulmonary Disease

## 2023-04-05 VITALS — BP 124/70 | HR 85 | Ht 67.0 in | Wt 181.0 lb

## 2023-04-05 DIAGNOSIS — G4733 Obstructive sleep apnea (adult) (pediatric): Secondary | ICD-10-CM | POA: Diagnosis not present

## 2023-04-05 NOTE — Progress Notes (Signed)
[facility]  Chief Complaint  Patient presents with   Sleep Apnea    Past Surgical History:  He  has a past surgical history that includes Colon surgery (1979) and Video bronchoscopy with endobronchial navigation (N/A, 10/17/2019).  Past Medical History:  Prostate cancer, Allergies, Gout, Crohn's disease, HLD, Coronary atherosclerosis, COVID 19 infection February 2023, Nasal polyposis  Constitutional:  BP 124/70   Pulse 85   Ht 5\' 7"  (1.702 m)   Wt 181 lb (82.1 kg)   SpO2 91%   BMI 28.35 kg/m   Brief Summary:  Dale Thompson is a 67 y.o. male former smoker with obstructive sleep apnea and emphysema.      Subjective:   Uses CPAP nightly.  No issues with mask fit or pressure.  Feels rested.  Doesn't need symbicort much.  Main issue is with allergies and sinuses.  Not having cough, wheeze, or sputum.  Physical Exam:   Appearance - well kempt   ENMT - no sinus tenderness, no oral exudate, no LAN, Mallampati 4 airway, no stridor, scalloped tongue  Respiratory - equal breath sounds bilaterally, no wheezing or rales  CV - s1s2 regular rate and rhythm, no murmurs  Ext - no clubbing, no edema  Skin - no rashes  Psych - normal mood and affect   Pulmonary testing:    Chest Imaging:  CT chest 06/29/21 >> atherosclerosis, centrilobular emphysema, 17 mm nodule LUL stable, new nodule cluster RUL up to 5 mm, smaller nodules in lower lobes b/l and RML CT chest 03/31/23 >> no change  Sleep Tests:  HST 11/23/21 >> AHI 43.6, SpO2 low 84%  Auto CPAP 03/05/23 to 04/03/23 >> used on 30 of 30 nights with average 9 hrs 10 min.  Average AHI 0.9 with median CPAP 8 and 95 th percentile CPAP 10 cm H2O  Social History:  He  reports that he quit smoking about 25 years ago. His smoking use included cigarettes. He started smoking about 47 years ago. He has a 22 pack-year smoking history. He has never used smokeless tobacco. He reports current  alcohol use. He reports that he does not use drugs.  Family History:  His family history includes Heart disease in his maternal grandfather.     Assessment/Plan:   Obstructive sleep apnea. - he is compliant with CPAP and reports benefit from therapy - uses [facility] for his DME - continue auto CPAP 5 to 20 cm H2O  History of tobacco abuse with centrilobular emphysema and history of allergies/asthma with nasal polyposis. - minimal respiratory symptoms at present - prn symbicort - defer PFT for now - he uses budesonide for sinus rinse; followed previously [facility]  Lung nodules. - followed by Dr. Charlett Lango with cardiothoracic surgery   Time Spent Involved in Patient Care on Day of Examination:  26 minutes  Follow up:   Patient Instructions  Follow up in 1 year  Medication List:   Allergies as of 04/05/2023   No Known Allergies      Medication List        Accurate as of April 05, 2023  2:14 PM. If you have any questions, ask your nurse or doctor.          allopurinol 100 MG tablet Commonly known as: ZYLOPRIM Take 200 mg by mouth daily.   aspirin 81 MG chewable tablet 1 tablet   b complex vitamins tablet Take 1 tablet by mouth daily.  betamethasone dipropionate 0.05 % ointment Commonly known as: DIPROLENE Apply 1 Application topically as needed (legs).   budesonide 0.5 MG/2ML nebulizer solution Commonly known as: PULMICORT Take 0.5 mg by nebulization as needed.   budesonide-formoterol 80-4.5 MCG/ACT inhaler Commonly known as: Symbicort Take 2 puffs first thing in am and then another 2 puffs about 12 hours later. What changed:  when to take this reasons to take this   CALCIUM PLUS VITAMIN D PO Take 1 tablet by mouth daily.   ciclopirox 0.77 % Susp Commonly known as: LOPROX as needed (breakouts).   fexofenadine 180 MG tablet Commonly known as: ALLEGRA Take 180 mg by mouth daily.   Mitigare 0.6 MG Caps Generic drug:  Colchicine as needed (gout flares).   pantoprazole 40 MG tablet Commonly known as: PROTONIX Take 40 mg by mouth daily.   rosuvastatin 20 MG tablet Commonly known as: CRESTOR Take 20 mg by mouth at bedtime.   VITAMIN D PO Take by mouth.        Signature:  Coralyn Helling, MD [facility] Pulmonary/Critical Care Pager - [phone] 04/05/2023, 2:14 PM

## 2023-04-05 NOTE — Patient Instructions (Signed)
Follow up in 1 year.

## 2023-04-08 ENCOUNTER — Ambulatory Visit
Admission: RE | Admit: 2023-04-08 | Discharge: 2023-04-08 | Disposition: A | Discharge status: Home / Self Care | Payer: Medicare HMO | Source: Ambulatory Visit | Attending: Urology | Admitting: Urology

## 2023-04-08 DIAGNOSIS — R972 Elevated prostate specific antigen [PSA]: Secondary | ICD-10-CM | POA: Diagnosis not present

## 2023-04-08 DIAGNOSIS — C61 Malignant neoplasm of prostate: Secondary | ICD-10-CM

## 2023-04-08 MED ORDER — GADOPICLENOL 0.5 MMOL/ML IV SOLN
8.0000 mL | Freq: Once | INTRAVENOUS | Status: AC | PRN
  Administered 2023-04-08: 8 mL via INTRAVENOUS

## 2023-04-14 DIAGNOSIS — G4733 Obstructive sleep apnea (adult) (pediatric): Secondary | ICD-10-CM | POA: Diagnosis not present

## 2023-04-18 ENCOUNTER — Other Ambulatory Visit: Payer: Medicare HMO

## 2023-04-18 ENCOUNTER — Ambulatory Visit: Payer: Medicare HMO | Admitting: Thoracic Surgery (Cardiothoracic Vascular Surgery)

## 2023-04-18 ENCOUNTER — Encounter: Payer: Self-pay | Admitting: Thoracic Surgery (Cardiothoracic Vascular Surgery)

## 2023-04-18 VITALS — BP 172/83 | HR 72 | Resp 20 | Ht 67.0 in | Wt 181.0 lb

## 2023-04-18 DIAGNOSIS — R911 Solitary pulmonary nodule: Secondary | ICD-10-CM

## 2023-04-18 DIAGNOSIS — R918 Other nonspecific abnormal finding of lung field: Secondary | ICD-10-CM

## 2023-04-18 NOTE — Progress Notes (Signed)
[address]       [place] [address]             [phone]     HPI: Mr. Dale Thompson returns for a scheduled follow-up visit regarding lung nodules.  Dale Thompson is a 67 year old man with a history of hyperlipidemia, prostate issues, asthma, gout, Crohn's disease, remote tobacco abuse, aberrant right subclavian artery, aortic atherosclerosis, coronary artery atherosclerosis, and multiple groundglass lung nodules.  He had a 22-pack-year history of smoking prior to quitting in 1998.  He was found to have groundglass nodules in the lungs bilaterally in 2018.  He has been followed since then.  In December 2022 the left upper lobe nodule increased in size.  Navigational bronchoscopy showed inflammation but no evidence of malignancy.  I last saw him in March.  He was doing well at that time.  He continues to feel well.  He is exercising on a regular basis.  No chest pain, pressure, tightness, or shortness of breath.  Does have occasional wheezing.  He is on multiple inhalers.  Past Medical History:  Diagnosis Date   Asthma    Cancer ([facility])    prostate, basal cell skin cancer   Coronary atherosclerosis    COVID-19 08/2021   Crohn disease ([facility])    Gout    Hyperlipidemia    Nasal polyposis    OSA (obstructive sleep apnea)    Seasonal allergies     Current Outpatient Medications  Medication Sig Dispense Refill   allopurinol (ZYLOPRIM) 100 MG tablet Take 200 mg by mouth daily.     aspirin 81 MG chewable tablet 1 tablet     b complex vitamins tablet Take 1 tablet by mouth daily.     betamethasone dipropionate (DIPROLENE) 0.05 % ointment Apply 1 Application topically as needed (legs).     budesonide (PULMICORT) 0.5 MG/2ML nebulizer solution Take 0.5 mg by nebulization as needed.  12   budesonide-formoterol (SYMBICORT) 80-4.5 MCG/ACT inhaler Take 2 puffs first thing in am and then another 2 puffs about 12 hours later. (Patient taking differently: as needed (allergies,  sob). Take 2 puffs first thing in am and then another 2 puffs about 12 hours later.) 1 Inhaler 11   Calcium Carbonate-Vitamin D (CALCIUM PLUS VITAMIN D PO) Take 1 tablet by mouth daily.     ciclopirox (LOPROX) 0.77 % SUSP as needed (breakouts).     Colchicine (MITIGARE) 0.6 MG CAPS as needed (gout flares).     fexofenadine (ALLEGRA) 180 MG tablet Take 180 mg by mouth daily.     pantoprazole (PROTONIX) 40 MG tablet Take 40 mg by mouth daily.     rosuvastatin (CRESTOR) 20 MG tablet Take 20 mg by mouth at bedtime.     VITAMIN D PO Take by mouth.     No current facility-administered medications for this visit.    Physical Exam BP (!) 172/83 (BP Location: Right Arm, Patient Position: Sitting, Cuff Size: Normal)   Pulse 72   Resp 20   Ht 5\' 7"  (1.702 m)   Wt 181 lb (82.1 kg)   SpO2 96% Comment: RA  BMI 28.70 kg/m  67 year old man in no acute distress Alert and oriented x 3 with no focal deficits Lungs faint wheeze right to the left Cardiac regular rate and rhythm No peripheral edema  Diagnostic Tests: CT CHEST WITHOUT CONTRAST   TECHNIQUE: Multidetector CT imaging of the chest was performed following the standard protocol without IV contrast.  RADIATION DOSE REDUCTION: This exam was performed according to the departmental dose-optimization program which includes automated exposure control, adjustment of the mA and/or kV according to patient size and/or use of iterative reconstruction technique.   COMPARISON:  Chest CT 09/22/2022, 02/23/2022 and 06/29/2021   FINDINGS: Cardiovascular: Again demonstrated is diffuse atherosclerosis of the aorta, great vessels and coronary arteries. There is an aberrant retroesophageal right subclavian artery. The heart size is normal. There is no pericardial effusion.   Mediastinum/Nodes: There are no enlarged mediastinal, hilar or axillary lymph nodes.Hilar assessment is limited by the lack of intravenous contrast, although the hilar contours  appear unchanged. The thyroid gland, trachea and esophagus demonstrate no significant findings.   Lungs/Pleura: No pleural effusion or pneumothorax. Stable mild biapical scarring. Again demonstrated are scattered pulmonary nodules bilaterally, including a part solid nodule in the left upper lobe which measures approximately 1.5 x 1.1 cm on image 34/5. This appears unchanged from the most recent prior study, without enlarging solid component. There are additional chronic linear nodular opacities in the right upper lobe and in both lower lobes which are also unchanged. No new or enlarging nodules are identified. Faint patchy ground-glass opacities anteriorly in the left lower lobe and adjacent lingula are likely inflammatory.   Upper abdomen: Diffusely decreased density throughout the liver again noted consistent with steatosis. No acute findings identified in the visualized upper abdomen. There is aortic atherosclerosis.   Musculoskeletal/Chest wall: There is no chest wall mass or suspicious osseous finding. No evidence of osseous metastatic disease.   Unless specific follow-up recommendations are mentioned in the findings or impression sections, no imaging follow-up of any mentioned incidental findings is recommended.   IMPRESSION: 1. No significant change in scattered pulmonary nodules bilaterally, including a part solid nodule in the left upper lobe. Based on previously described changes compared with more remote priors, continued annual CT surveillance recommended. 2. No new or enlarging nodules identified. 3. No evidence of metastatic disease. 4. Hepatic steatosis. 5.  Aortic Atherosclerosis (ICD10-I70.0).     Electronically Signed   By: Carey Bullocks M.D.   On: 03/31/2023 12:39 I personally reviewed the CT images.  No change in the lung nodules.  No new nodules.  Aortic and coronary atherosclerosis.  Impression: Dale Thompson is a 67 year old man with a history of  hyperlipidemia, prostate issues, asthma, gout, Crohn's disease, remote tobacco abuse, aberrant right subclavian artery, aortic atherosclerosis, coronary artery atherosclerosis, and multiple groundglass lung nodules.  He had a 22-pack-year history of smoking prior to quitting in 1998.  Lung nodules-groundglass opacities in the lungs bilaterally.  Minimal change even dating back to 2018.  Previous biopsy showed inflammation and scarring but no evidence of malignancy.  No change over the past couple of years.  We discussed continue to follow-up at extending the time interval to a year.  He favors that.  Tobacco use-quit smoking 25 years ago.  Coronary and aortic atherosclerosis-he is on Crestor.  No symptoms.  He is also followed by cardiology  Elevated blood pressure-was elevated at his last visit as well.  However normal 3 days later when he was seen by cardiology.  Advised him to monitor that.  Plan: Return in 1 year with CT chest  Loreli Slot, MD [facility] [phone]

## 2023-05-31 DIAGNOSIS — J324 Chronic pansinusitis: Secondary | ICD-10-CM | POA: Diagnosis not present

## 2023-08-10 DIAGNOSIS — H1789 Other corneal scars and opacities: Secondary | ICD-10-CM | POA: Diagnosis not present

## 2023-08-10 DIAGNOSIS — H2513 Age-related nuclear cataract, bilateral: Secondary | ICD-10-CM | POA: Diagnosis not present

## 2023-08-15 DIAGNOSIS — D2261 Melanocytic nevi of right upper limb, including shoulder: Secondary | ICD-10-CM | POA: Diagnosis not present

## 2023-08-15 DIAGNOSIS — L814 Other melanin hyperpigmentation: Secondary | ICD-10-CM | POA: Diagnosis not present

## 2023-08-15 DIAGNOSIS — D225 Melanocytic nevi of trunk: Secondary | ICD-10-CM | POA: Diagnosis not present

## 2023-08-15 DIAGNOSIS — L821 Other seborrheic keratosis: Secondary | ICD-10-CM | POA: Diagnosis not present

## 2023-08-15 DIAGNOSIS — Z85828 Personal history of other malignant neoplasm of skin: Secondary | ICD-10-CM | POA: Diagnosis not present

## 2023-08-15 DIAGNOSIS — L718 Other rosacea: Secondary | ICD-10-CM | POA: Diagnosis not present

## 2023-08-15 DIAGNOSIS — L820 Inflamed seborrheic keratosis: Secondary | ICD-10-CM | POA: Diagnosis not present

## 2023-08-15 DIAGNOSIS — D2271 Melanocytic nevi of right lower limb, including hip: Secondary | ICD-10-CM | POA: Diagnosis not present

## 2023-08-15 DIAGNOSIS — D2272 Melanocytic nevi of left lower limb, including hip: Secondary | ICD-10-CM | POA: Diagnosis not present

## 2023-08-28 DIAGNOSIS — M810 Age-related osteoporosis without current pathological fracture: Secondary | ICD-10-CM | POA: Diagnosis not present

## 2023-08-28 DIAGNOSIS — I700 Atherosclerosis of aorta: Secondary | ICD-10-CM | POA: Diagnosis not present

## 2023-08-28 DIAGNOSIS — Z125 Encounter for screening for malignant neoplasm of prostate: Secondary | ICD-10-CM | POA: Diagnosis not present

## 2023-08-28 DIAGNOSIS — E785 Hyperlipidemia, unspecified: Secondary | ICD-10-CM | POA: Diagnosis not present

## 2023-09-14 DIAGNOSIS — M1A9XX0 Chronic gout, unspecified, without tophus (tophi): Secondary | ICD-10-CM | POA: Diagnosis not present

## 2023-09-14 DIAGNOSIS — K5090 Crohn's disease, unspecified, without complications: Secondary | ICD-10-CM | POA: Diagnosis not present

## 2023-09-14 DIAGNOSIS — K219 Gastro-esophageal reflux disease without esophagitis: Secondary | ICD-10-CM | POA: Diagnosis not present

## 2023-09-14 DIAGNOSIS — R911 Solitary pulmonary nodule: Secondary | ICD-10-CM | POA: Diagnosis not present

## 2023-09-14 DIAGNOSIS — Z0000 Encounter for general adult medical examination without abnormal findings: Secondary | ICD-10-CM | POA: Diagnosis not present

## 2023-09-14 DIAGNOSIS — I700 Atherosclerosis of aorta: Secondary | ICD-10-CM | POA: Diagnosis not present

## 2023-09-14 DIAGNOSIS — J4520 Mild intermittent asthma, uncomplicated: Secondary | ICD-10-CM | POA: Diagnosis not present

## 2023-09-14 DIAGNOSIS — R292 Abnormal reflex: Secondary | ICD-10-CM | POA: Diagnosis not present

## 2023-09-14 DIAGNOSIS — G4733 Obstructive sleep apnea (adult) (pediatric): Secondary | ICD-10-CM | POA: Diagnosis not present

## 2023-09-14 DIAGNOSIS — I2510 Atherosclerotic heart disease of native coronary artery without angina pectoris: Secondary | ICD-10-CM | POA: Diagnosis not present

## 2023-09-14 DIAGNOSIS — E291 Testicular hypofunction: Secondary | ICD-10-CM | POA: Diagnosis not present

## 2023-09-14 DIAGNOSIS — M810 Age-related osteoporosis without current pathological fracture: Secondary | ICD-10-CM | POA: Diagnosis not present

## 2023-09-26 LAB — LAB REPORT - SCANNED: EGFR: 88

## 2023-10-02 ENCOUNTER — Encounter: Payer: Self-pay | Admitting: Cardiovascular Disease

## 2023-10-02 ENCOUNTER — Ambulatory Visit: Payer: Medicare HMO | Attending: Cardiovascular Disease | Admitting: Cardiovascular Disease

## 2023-10-02 VITALS — BP 134/70 | HR 76 | Ht 67.0 in | Wt 177.0 lb

## 2023-10-02 DIAGNOSIS — I2510 Atherosclerotic heart disease of native coronary artery without angina pectoris: Secondary | ICD-10-CM | POA: Diagnosis not present

## 2023-10-02 DIAGNOSIS — E782 Mixed hyperlipidemia: Secondary | ICD-10-CM | POA: Diagnosis not present

## 2023-10-02 DIAGNOSIS — R931 Abnormal findings on diagnostic imaging of heart and coronary circulation: Secondary | ICD-10-CM | POA: Diagnosis not present

## 2023-10-02 NOTE — Progress Notes (Signed)
 10/02/2023 Dale Thompson   1955-08-29  [id number]  Primary Physician Merri Brunette, MD Primary Cardiologist: Runell Gess MD [name], [place]  HPI:  Dale Thompson is a 68 y.o.   thin and fit appearing married Caucasian male father 2, grandfather 2 grandchildren who is retired from working at [employer] for 36 years.  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  [place] also [place].  He was referred by Dr. Renne Crigler, his primary care physician, for evaluation of coronary calcification seen on screening chest CT. I last saw him in the office 09/14/2021.  His risk factors include treated hyperlipidemia currently on rosuvastatin.  There is no family history for heart disease.  Never had a heart tach or stroke.  Has had remote Crohn's disease back in 83s which has been quiesced sent and low grade prostate cancer which is being carefully surveyed.  He works out on the elliptical for an hour a day without symptoms.  He did get evaluated at [facility] cardiology in 2015 with a negative GXT and 2D echo.  He is followed by Dr. Dorris Fetch for a pulmonary nodule.  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  He remains active and is completely asymptomatic.  He did have COVID this past January that lasted for several days.  He was treated with Paxlovid by his PCP.  Should be noted that he did have COVID-19 vaccination as well as several boosters.  Since I saw him 2 years ago he is remained stable.  He still very active and works on the elliptical.  He did have a coronary calcium score performed 10/02/2020 which was 552.  He is completely asymptomatic.  He apparently has been enrolled in a clinical trial looking at LP(a).     Current Meds  Medication Sig   allopurinol (ZYLOPRIM) 100 MG tablet Take 200  mg by mouth daily.   aspirin 81 MG chewable tablet 1 tablet   b complex vitamins tablet Take 1 tablet by mouth daily.   betamethasone dipropionate (DIPROLENE) 0.05 % ointment Apply 1 Application topically as needed (legs).   budesonide (PULMICORT) 0.5 MG/2ML nebulizer solution Take 0.5 mg by nebulization as needed.   budesonide-formoterol (SYMBICORT) 80-4.5 MCG/ACT inhaler Take 2 puffs first thing in am and then another 2 puffs about 12 hours later. (Patient taking differently: as needed (allergies, sob). Take 2 puffs first thing in am and then another 2 puffs about 12 hours later.)   Calcium Carbonate-Vitamin D (CALCIUM PLUS VITAMIN D PO) Take 1 tablet by mouth daily.   ciclopirox (LOPROX) 0.77 % SUSP as needed (breakouts).   Colchicine (MITIGARE) 0.6 MG CAPS as needed (gout flares).   fexofenadine (ALLEGRA) 180 MG tablet Take 180 mg by mouth daily.   pantoprazole (PROTONIX) 40 MG tablet Take 40 mg by mouth daily.   rosuvastatin (CRESTOR) 20 MG tablet Take 20 mg by mouth at bedtime.   Testosterone 20.25 MG/ACT (1.62%) GEL SMARTSIG:1 pump Topical Daily   VITAMIN D PO Take by mouth.     No Known Allergies  Social History   Socioeconomic History   Marital status: Married    Spouse name: Not on file   Number of children: 2   Years of education: Not on file   Highest education level: Not  on file  Occupational History   Occupation: Executive  Tobacco Use   Smoking status: Former    Current packs/day: 0.00    Average packs/day: 1 pack/day for 22.0 years (22.0 ttl pk-yrs)    Types: Cigarettes    Start date: 05/19/1975    Quit date: 05/18/1997    Years since quitting: 26.3   Smokeless tobacco: Never  Vaping Use   Vaping status: Never Used  Substance and Sexual Activity   Alcohol use: Yes    Comment: 12 beers per wk   Drug use: No   Sexual activity: Not on file  Other Topics Concern   Not on file  Social History Narrative   Not on file   Social Drivers of Health   Financial  Resource Strain: Not on file  Food Insecurity: Not on file  Transportation Needs: Not on file  Physical Activity: Not on file  Stress: Not on file  Social Connections: Not on file  Intimate Partner Violence: Not on file     Review of Systems: General: negative for chills, fever, night sweats or weight changes.  Cardiovascular: negative for chest pain, dyspnea on exertion, edema, orthopnea, palpitations, paroxysmal nocturnal dyspnea or shortness of breath Dermatological: negative for rash Respiratory: negative for cough or wheezing Urologic: negative for hematuria Abdominal: negative for nausea, vomiting, diarrhea, bright red blood per rectum, melena, or hematemesis Neurologic: negative for visual changes, syncope, or dizziness All other systems reviewed and are otherwise negative except as noted above.    Blood pressure 134/70, pulse 76, height 5\' 7"  (1.702 m), weight 177 lb (80.3 kg), SpO2 96%.  General appearance: alert and no distress Neck: no adenopathy, no carotid bruit, no JVD, supple, symmetrical, trachea midline, and thyroid not enlarged, symmetric, no tenderness/mass/nodules Lungs: clear to auscultation bilaterally Heart: regular rate and rhythm, S1, S2 normal, no murmur, click, rub or gallop Extremities: extremities normal, atraumatic, no cyanosis or edema Pulses: 2+ and symmetric Skin: Skin color, texture, turgor normal. No rashes or lesions Neurologic: Grossly normal  EKG EKG Interpretation Date/Time:  Monday October 02 2023 10:01:13 EDT Ventricular Rate:  75 PR Interval:  148 QRS Duration:  94 QT Interval:  380 QTC Calculation: 424 R Axis:   56  Text Interpretation: Normal sinus rhythm Normal ECG No previous ECGs available Confirmed by Nanetta Batty [phone]) on 10/02/2023 10:03:19 AM    ASSESSMENT AND PLAN:   Hyperlipidemia History of hyperlipidemia on statin therapy with lipid profile recently performed 08/28/2023 revealing total cholesterol 121, LDL 54 and  HDL 31.  He is at goal for secondary prevention given his elevated coronary calcium score.  Agatston coronary artery calcium score less than 100 Coronary calcium score of 552 performed 10/02/2020.  He is completely asymptomatic.  Based on this we were more aggressive with risk factor modification including lipid lowering.     Runell Gess MD FACP,FACC,FAHA, [facility] 10/02/2023 10:36 AM

## 2023-10-02 NOTE — Assessment & Plan Note (Signed)
 Coronary calcium score of 552 performed 10/02/2020.  He is completely asymptomatic.  Based on this we were more aggressive with risk factor modification including lipid lowering.

## 2023-10-02 NOTE — Patient Instructions (Signed)

## 2023-10-02 NOTE — Assessment & Plan Note (Signed)
 History of hyperlipidemia on statin therapy with lipid profile recently performed 08/28/2023 revealing total cholesterol 121, LDL 54 and HDL 31.  He is at goal for secondary prevention given his elevated coronary calcium score.

## 2023-10-17 DIAGNOSIS — L820 Inflamed seborrheic keratosis: Secondary | ICD-10-CM | POA: Diagnosis not present

## 2023-10-17 DIAGNOSIS — D485 Neoplasm of uncertain behavior of skin: Secondary | ICD-10-CM | POA: Diagnosis not present

## 2023-11-29 DIAGNOSIS — G4733 Obstructive sleep apnea (adult) (pediatric): Secondary | ICD-10-CM | POA: Diagnosis not present

## 2024-01-01 DIAGNOSIS — K760 Fatty (change of) liver, not elsewhere classified: Secondary | ICD-10-CM | POA: Diagnosis not present

## 2024-01-01 DIAGNOSIS — I700 Atherosclerosis of aorta: Secondary | ICD-10-CM | POA: Diagnosis not present

## 2024-01-01 DIAGNOSIS — M546 Pain in thoracic spine: Secondary | ICD-10-CM | POA: Diagnosis not present

## 2024-01-01 DIAGNOSIS — S51812A Laceration without foreign body of left forearm, initial encounter: Secondary | ICD-10-CM | POA: Diagnosis not present

## 2024-01-01 DIAGNOSIS — Z87891 Personal history of nicotine dependence: Secondary | ICD-10-CM | POA: Diagnosis not present

## 2024-01-01 DIAGNOSIS — S1181XA Laceration without foreign body of other specified part of neck, initial encounter: Secondary | ICD-10-CM | POA: Diagnosis not present

## 2024-01-01 DIAGNOSIS — S299XXA Unspecified injury of thorax, initial encounter: Secondary | ICD-10-CM | POA: Diagnosis not present

## 2024-01-01 DIAGNOSIS — M7989 Other specified soft tissue disorders: Secondary | ICD-10-CM | POA: Diagnosis not present

## 2024-01-01 DIAGNOSIS — I672 Cerebral atherosclerosis: Secondary | ICD-10-CM | POA: Diagnosis not present

## 2024-01-01 DIAGNOSIS — Z23 Encounter for immunization: Secondary | ICD-10-CM | POA: Diagnosis not present

## 2024-01-01 DIAGNOSIS — S1191XA Laceration without foreign body of unspecified part of neck, initial encounter: Secondary | ICD-10-CM | POA: Diagnosis not present

## 2024-01-01 DIAGNOSIS — Y92410 Unspecified street and highway as the place of occurrence of the external cause: Secondary | ICD-10-CM | POA: Diagnosis not present
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
## 2024-01-03 ENCOUNTER — Encounter: Payer: Self-pay | Admitting: Thoracic Surgery (Cardiothoracic Vascular Surgery)

## 2024-04-02 DIAGNOSIS — E785 Hyperlipidemia, unspecified: Secondary | ICD-10-CM | POA: Diagnosis not present

## 2024-04-02 DIAGNOSIS — I639 Cerebral infarction, unspecified: Secondary | ICD-10-CM | POA: Diagnosis not present

## 2024-04-04 ENCOUNTER — Other Ambulatory Visit: Payer: Self-pay | Admitting: Thoracic Surgery (Cardiothoracic Vascular Surgery)

## 2024-04-04 DIAGNOSIS — R918 Other nonspecific abnormal finding of lung field: Secondary | ICD-10-CM

## 2024-04-10 ENCOUNTER — Ambulatory Visit (HOSPITAL_COMMUNITY)
Admission: RE | Admit: 2024-04-10 | Discharge: 2024-04-10 | Disposition: A | Discharge status: Home / Self Care | Source: Ambulatory Visit | Attending: Thoracic Surgery (Cardiothoracic Vascular Surgery) | Admitting: Thoracic Surgery (Cardiothoracic Vascular Surgery)

## 2024-04-10 DIAGNOSIS — I700 Atherosclerosis of aorta: Secondary | ICD-10-CM | POA: Diagnosis not present

## 2024-04-10 DIAGNOSIS — R918 Other nonspecific abnormal finding of lung field: Secondary | ICD-10-CM | POA: Diagnosis not present

## 2024-04-23 ENCOUNTER — Ambulatory Visit

## 2024-04-23 VITALS — BP 153/87 | HR 84 | Resp 18 | Ht 67.0 in | Wt 185.0 lb

## 2024-04-23 DIAGNOSIS — R918 Other nonspecific abnormal finding of lung field: Secondary | ICD-10-CM | POA: Diagnosis not present

## 2024-04-23 NOTE — Patient Instructions (Signed)
-  Follow up in one year with CT of chest
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
## 2024-05-01 ENCOUNTER — Ambulatory Visit: Admitting: Surgery

## 2024-05-03 DIAGNOSIS — R911 Solitary pulmonary nodule: Secondary | ICD-10-CM | POA: Diagnosis not present

## 2024-05-03 DIAGNOSIS — I422 Other hypertrophic cardiomyopathy: Secondary | ICD-10-CM | POA: Diagnosis not present

## 2024-05-03 DIAGNOSIS — I639 Cerebral infarction, unspecified: Secondary | ICD-10-CM | POA: Diagnosis not present

## 2024-09-30 ENCOUNTER — Ambulatory Visit: Admitting: Cardiovascular Disease
